# Patient Record
Sex: Male | Born: 1980 | Race: Black or African American | Hispanic: No | Marital: Single | Smoking: Never smoker
Health system: Southern US, Community
[De-identification: ages and names within clinical notes are randomized; demographics above are authoritative.]

---

## 2009-01-27 ENCOUNTER — Emergency Department (HOSPITAL_BASED_OUTPATIENT_CLINIC_OR_DEPARTMENT_OTHER): Admission: EM | Admit: 2009-01-27 | Discharge: 2009-01-27 | Payer: Self-pay | Admitting: Emergency Medicine

## 2010-11-01 LAB — POCT TOXICOLOGY PANEL

## 2010-11-01 LAB — CBC
HCT: 47.6 % (ref 39.0–52.0)
MCHC: 33.2 g/dL (ref 30.0–36.0)
MCV: 82.9 fL (ref 78.0–100.0)
Platelets: 179 10*3/uL (ref 150–400)
RDW: 11.6 % (ref 11.5–15.5)

## 2010-11-01 LAB — DIFFERENTIAL
Basophils Absolute: 0.2 10*3/uL — ABNORMAL HIGH (ref 0.0–0.1)
Lymphocytes Relative: 12 % (ref 12–46)
Monocytes Absolute: 0.9 10*3/uL (ref 0.1–1.0)
Monocytes Relative: 8 % (ref 3–12)
Neutro Abs: 8.9 10*3/uL — ABNORMAL HIGH (ref 1.7–7.7)

## 2010-11-01 LAB — COMPREHENSIVE METABOLIC PANEL
Albumin: 4.3 g/dL (ref 3.5–5.2)
BUN: 14 mg/dL (ref 6–23)
Creatinine, Ser: 1.2 mg/dL (ref 0.4–1.5)
Total Bilirubin: 0.8 mg/dL (ref 0.3–1.2)
Total Protein: 7.7 g/dL (ref 6.0–8.3)

## 2015-06-16 ENCOUNTER — Ambulatory Visit: Payer: Self-pay | Admitting: Obstetrics and Gynecology

## 2015-07-04 ENCOUNTER — Ambulatory Visit: Payer: Self-pay | Admitting: Obstetrics and Gynecology

## 2015-10-14 ENCOUNTER — Emergency Department (HOSPITAL_COMMUNITY)
Admission: EM | Admit: 2015-10-14 | Discharge: 2015-10-14 | Disposition: A | Discharge status: Home / Self Care | Payer: 59 | Source: Home / Self Care | Attending: Family Medicine | Admitting: Family Medicine

## 2015-10-14 ENCOUNTER — Encounter (HOSPITAL_COMMUNITY): Payer: Self-pay | Admitting: Emergency Medicine

## 2015-10-14 DIAGNOSIS — J302 Other seasonal allergic rhinitis: Secondary | ICD-10-CM | POA: Diagnosis not present

## 2015-10-14 MED ORDER — PREDNISONE 50 MG PO TABS: ORAL_TABLET | ORAL | Status: DC

## 2015-10-14 MED ORDER — IPRATROPIUM BROMIDE 0.06 % NA SOLN: 2.0000 | Freq: Four times a day (QID) | NASAL | Status: DC

## 2015-10-14 MED ORDER — METHYLPREDNISOLONE ACETATE 40 MG/ML IJ SUSP: 80.0000 mg | Freq: Once | INTRAMUSCULAR | Status: DC

## 2015-10-14 NOTE — ED Provider Notes (Signed)
CSN: [id number]     Arrival date & time 10/14/15  1302 History   First MD Initiated Contact with Patient 10/14/15 1323     No chief complaint on file.  (Consider location/radiation/quality/duration/timing/severity/associated sxs/prior Treatment) Patient is a 35 y.o. male presenting with allergic reaction. The history is provided by the patient.  Allergic Reaction Presenting symptoms: itching   Severity:  Moderate Prior allergic episodes:  No prior episodes Relieved by:  None tried Worsened by:  Nothing tried Ineffective treatments:  None tried   No past medical history on file. No past surgical history on file. No family history on file. Social History  Substance Use Topics  . Smoking status: Not on file  . Smokeless tobacco: Not on file  . Alcohol Use: Not on file    Review of Systems  Constitutional: Positive for fever. Negative for chills and appetite change.  HENT: Positive for congestion, postnasal drip and rhinorrhea.   Respiratory: Negative.   Cardiovascular: Negative.   Gastrointestinal: Negative.   Genitourinary: Negative.   Musculoskeletal: Negative.   Skin: Positive for itching.  All other systems reviewed and are negative.   Allergies  Review of patient's allergies indicates not on file.  Home Medications   Prior to Admission medications   Medication Sig Start Date End Date Taking? Authorizing Provider  ipratropium (ATROVENT) 0.06 % nasal spray Place 2 sprays into both nostrils 4 (four) times daily. 10/14/15   Linna HoffJames D Delaynie Stetzer, MD  predniSONE (DELTASONE) 50 MG tablet 1 tab daily for 2 days then 1/2 tab daily for 2 days. 10/14/15   Linna HoffJames D Walid Haig, MD   Meds Ordered and Administered this Visit   Medications  methylPREDNISolone acetate (DEPO-MEDROL) injection 80 mg (not administered)    BP 112/72 mmHg  Pulse 113  Temp(Src) 100.5 F (38.1 C) (Oral)  Resp 16  SpO2 96% No data found.   Physical Exam  Constitutional: He is oriented to person, place, and  time. He appears well-developed and well-nourished. No distress.  HENT:  Right Ear: External ear normal.  Left Ear: External ear normal.  Mouth/Throat: Oropharynx is clear and moist.  Eyes: Pupils are equal, round, and reactive to light.  Neck: Normal range of motion. Neck supple.  Cardiovascular: Normal rate, regular rhythm, normal heart sounds and intact distal pulses.   Pulmonary/Chest: Effort normal and breath sounds normal.  Abdominal: Soft. Bowel sounds are normal.  Neurological: He is alert and oriented to person, place, and time.  Skin: Skin is warm.  Nursing note and vitals reviewed.   ED Course  Procedures (including critical care time)  Labs Review Labs Reviewed - No data to display  Imaging Review No results found.   Visual Acuity Review  Right Eye Distance:   Left Eye Distance:   Bilateral Distance:    Right Eye Near:   Left Eye Near:    Bilateral Near:         MDM   1. Seasonal allergic rhinitis        Linna HoffJames D Jenice Leiner, MD 10/14/15 1357

## 2015-10-14 NOTE — ED Notes (Signed)
Onset Sunday 3/19 of sinus drainage and congestion, runny nose, cough due to congestion

## 2016-11-16 DIAGNOSIS — Z13220 Encounter for screening for lipoid disorders: Secondary | ICD-10-CM | POA: Diagnosis not present

## 2016-11-16 DIAGNOSIS — Z0000 Encounter for general adult medical examination without abnormal findings: Secondary | ICD-10-CM | POA: Diagnosis not present

## 2017-06-22 DIAGNOSIS — M545 Low back pain: Secondary | ICD-10-CM | POA: Diagnosis not present

## 2017-11-21 DIAGNOSIS — Z0000 Encounter for general adult medical examination without abnormal findings: Secondary | ICD-10-CM | POA: Diagnosis not present

## 2018-07-01 ENCOUNTER — Emergency Department (HOSPITAL_COMMUNITY)
Admission: EM | Admit: 2018-07-01 | Discharge: 2018-07-01 | Disposition: A | Discharge status: Home / Self Care | Payer: 59 | Attending: Emergency Medicine | Admitting: Emergency Medicine

## 2018-07-01 ENCOUNTER — Encounter (HOSPITAL_COMMUNITY): Payer: Self-pay | Admitting: Emergency Medicine

## 2018-07-01 ENCOUNTER — Emergency Department (HOSPITAL_COMMUNITY): Payer: 59

## 2018-07-01 ENCOUNTER — Other Ambulatory Visit: Payer: Self-pay

## 2018-07-01 DIAGNOSIS — R102 Pelvic and perineal pain: Secondary | ICD-10-CM | POA: Diagnosis not present

## 2018-07-01 DIAGNOSIS — Y929 Unspecified place or not applicable: Secondary | ICD-10-CM | POA: Diagnosis not present

## 2018-07-01 DIAGNOSIS — M79604 Pain in right leg: Secondary | ICD-10-CM | POA: Diagnosis not present

## 2018-07-01 DIAGNOSIS — S8991XA Unspecified injury of right lower leg, initial encounter: Secondary | ICD-10-CM | POA: Diagnosis not present

## 2018-07-01 DIAGNOSIS — S81811A Laceration without foreign body, right lower leg, initial encounter: Secondary | ICD-10-CM

## 2018-07-01 DIAGNOSIS — G4489 Other headache syndrome: Secondary | ICD-10-CM | POA: Diagnosis not present

## 2018-07-01 DIAGNOSIS — R109 Unspecified abdominal pain: Secondary | ICD-10-CM | POA: Insufficient documentation

## 2018-07-01 DIAGNOSIS — Y999 Unspecified external cause status: Secondary | ICD-10-CM | POA: Insufficient documentation

## 2018-07-01 DIAGNOSIS — M542 Cervicalgia: Secondary | ICD-10-CM | POA: Insufficient documentation

## 2018-07-01 DIAGNOSIS — R0789 Other chest pain: Secondary | ICD-10-CM | POA: Diagnosis not present

## 2018-07-01 DIAGNOSIS — R079 Chest pain, unspecified: Secondary | ICD-10-CM | POA: Diagnosis not present

## 2018-07-01 DIAGNOSIS — S199XXA Unspecified injury of neck, initial encounter: Secondary | ICD-10-CM | POA: Diagnosis not present

## 2018-07-01 DIAGNOSIS — R51 Headache: Secondary | ICD-10-CM | POA: Diagnosis not present

## 2018-07-01 DIAGNOSIS — S0993XA Unspecified injury of face, initial encounter: Secondary | ICD-10-CM | POA: Diagnosis not present

## 2018-07-01 DIAGNOSIS — S299XXA Unspecified injury of thorax, initial encounter: Secondary | ICD-10-CM | POA: Diagnosis not present

## 2018-07-01 DIAGNOSIS — S3991XA Unspecified injury of abdomen, initial encounter: Secondary | ICD-10-CM | POA: Diagnosis not present

## 2018-07-01 DIAGNOSIS — Y9389 Activity, other specified: Secondary | ICD-10-CM | POA: Diagnosis not present

## 2018-07-01 DIAGNOSIS — Z79899 Other long term (current) drug therapy: Secondary | ICD-10-CM | POA: Diagnosis not present

## 2018-07-01 DIAGNOSIS — S0990XA Unspecified injury of head, initial encounter: Secondary | ICD-10-CM | POA: Diagnosis not present

## 2018-07-01 LAB — PROTIME-INR
INR: 0.98
Prothrombin Time: 12.9 seconds (ref 11.4–15.2)

## 2018-07-01 LAB — I-STAT CHEM 8, ED
BUN: 10 mg/dL (ref 6–20)
CALCIUM ION: 1.09 mmol/L — AB (ref 1.15–1.40)
CHLORIDE: 101 mmol/L (ref 98–111)
Creatinine, Ser: 1.1 mg/dL (ref 0.61–1.24)
GLUCOSE: 146 mg/dL — AB (ref 70–99)
HCT: 45 % (ref 39.0–52.0)
Hemoglobin: 15.3 g/dL (ref 13.0–17.0)
Potassium: 3.6 mmol/L (ref 3.5–5.1)
SODIUM: 137 mmol/L (ref 135–145)
TCO2: 28 mmol/L (ref 22–32)

## 2018-07-01 LAB — COMPREHENSIVE METABOLIC PANEL
ALK PHOS: 68 U/L (ref 38–126)
ALT: 58 U/L — ABNORMAL HIGH (ref 0–44)
ANION GAP: 13 (ref 5–15)
AST: 41 U/L (ref 15–41)
Albumin: 3.9 g/dL (ref 3.5–5.0)
BILIRUBIN TOTAL: 1.1 mg/dL (ref 0.3–1.2)
BUN: 9 mg/dL (ref 6–20)
CALCIUM: 8.9 mg/dL (ref 8.9–10.3)
CO2: 24 mmol/L (ref 22–32)
Chloride: 100 mmol/L (ref 98–111)
Creatinine, Ser: 1.17 mg/dL (ref 0.61–1.24)
GFR calc Af Amer: >60 mL/min (ref >60)
GFR calc non Af Amer: >60 mL/min (ref >60)
Glucose, Bld: 151 mg/dL — ABNORMAL HIGH (ref 70–99)
POTASSIUM: 3.9 mmol/L (ref 3.5–5.1)
Sodium: 137 mmol/L (ref 135–145)
TOTAL PROTEIN: 6.7 g/dL (ref 6.5–8.1)

## 2018-07-01 LAB — URINALYSIS, ROUTINE W REFLEX MICROSCOPIC
Bilirubin Urine: NEGATIVE
Glucose, UA: NEGATIVE mg/dL
Hgb urine dipstick: NEGATIVE
Ketones, ur: NEGATIVE mg/dL
Leukocytes, UA: NEGATIVE
Nitrite: NEGATIVE
Protein, ur: NEGATIVE mg/dL
Specific Gravity, Urine: 1.014 (ref 1.005–1.030)
pH: 9 — ABNORMAL HIGH (ref 5.0–8.0)

## 2018-07-01 LAB — CBC
HCT: 45.4 % (ref 39.0–52.0)
HEMOGLOBIN: 14.7 g/dL (ref 13.0–17.0)
MCH: 26.6 pg (ref 26.0–34.0)
MCHC: 32.4 g/dL (ref 30.0–36.0)
MCV: 82.1 fL (ref 80.0–100.0)
PLATELETS: 204 10*3/uL (ref 150–400)
RBC: 5.53 MIL/uL (ref 4.22–5.81)
RDW: 12.2 % (ref 11.5–15.5)
WBC: 6.7 10*3/uL (ref 4.0–10.5)
nRBC: 0 % (ref 0.0–0.2)

## 2018-07-01 LAB — CDS SEROLOGY

## 2018-07-01 LAB — SAMPLE TO BLOOD BANK

## 2018-07-01 LAB — I-STAT CG4 LACTIC ACID, ED: Lactic Acid, Venous: 1.75 mmol/L (ref 0.5–1.9)

## 2018-07-01 MED ORDER — KETOROLAC TROMETHAMINE 30 MG/ML IJ SOLN
15.0000 mg | Freq: Once | INTRAMUSCULAR | Status: AC
  Administered 2018-07-01: 15 mg via INTRAVENOUS
  Filled 2018-07-01: qty 1

## 2018-07-01 MED ORDER — MORPHINE SULFATE (PF) 4 MG/ML IV SOLN
4.0000 mg | Freq: Once | INTRAVENOUS | Status: AC
Start: 2018-07-01 — End: 2018-07-01
  Administered 2018-07-01: 4 mg via INTRAVENOUS
  Filled 2018-07-01: qty 1

## 2018-07-01 MED ORDER — IOHEXOL 300 MG/ML  SOLN
100.0000 mL | Freq: Once | INTRAMUSCULAR | Status: AC | PRN
  Administered 2018-07-01: 100 mL via INTRAVENOUS

## 2018-07-01 MED ORDER — METHOCARBAMOL 500 MG PO TABS: 500.0000 mg | ORAL_TABLET | Freq: Two times a day (BID) | ORAL | 0 refills | Status: DC | PRN

## 2018-07-01 MED ORDER — LIDOCAINE HCL (PF) 1 % IJ SOLN
5.0000 mL | Freq: Once | INTRAMUSCULAR | Status: AC
  Administered 2018-07-01: 5 mL via INTRADERMAL
  Filled 2018-07-01: qty 5

## 2018-07-01 NOTE — ED Notes (Signed)
Pt transported toi CT
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
## 2018-07-01 NOTE — ED Notes (Signed)
Pt transported to CT ?
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
## 2018-07-06 DIAGNOSIS — G479 Sleep disorder, unspecified: Secondary | ICD-10-CM | POA: Diagnosis not present

## 2018-07-06 DIAGNOSIS — S81811A Laceration without foreign body, right lower leg, initial encounter: Secondary | ICD-10-CM | POA: Diagnosis not present

## 2018-07-06 DIAGNOSIS — R079 Chest pain, unspecified: Secondary | ICD-10-CM | POA: Diagnosis not present

## 2018-07-10 DIAGNOSIS — Z4802 Encounter for removal of sutures: Secondary | ICD-10-CM | POA: Diagnosis not present

## 2018-07-10 DIAGNOSIS — S81812D Laceration without foreign body, left lower leg, subsequent encounter: Secondary | ICD-10-CM | POA: Diagnosis not present

## 2018-07-10 DIAGNOSIS — R079 Chest pain, unspecified: Secondary | ICD-10-CM | POA: Diagnosis not present

## 2018-07-31 DIAGNOSIS — R079 Chest pain, unspecified: Secondary | ICD-10-CM | POA: Diagnosis not present

## 2018-07-31 DIAGNOSIS — M545 Low back pain: Secondary | ICD-10-CM | POA: Diagnosis not present

## 2018-07-31 DIAGNOSIS — M25532 Pain in left wrist: Secondary | ICD-10-CM | POA: Diagnosis not present

## 2018-08-11 DIAGNOSIS — R55 Syncope and collapse: Secondary | ICD-10-CM | POA: Diagnosis not present

## 2018-08-11 DIAGNOSIS — M25532 Pain in left wrist: Secondary | ICD-10-CM | POA: Insufficient documentation

## 2018-08-11 DIAGNOSIS — M545 Low back pain: Secondary | ICD-10-CM | POA: Diagnosis not present

## 2018-08-19 DIAGNOSIS — R55 Syncope and collapse: Secondary | ICD-10-CM | POA: Diagnosis not present

## 2018-08-19 DIAGNOSIS — M5416 Radiculopathy, lumbar region: Secondary | ICD-10-CM | POA: Diagnosis not present

## 2018-08-25 DIAGNOSIS — M25532 Pain in left wrist: Secondary | ICD-10-CM | POA: Diagnosis not present

## 2018-08-25 DIAGNOSIS — M545 Low back pain: Secondary | ICD-10-CM | POA: Diagnosis not present

## 2018-09-06 DIAGNOSIS — M545 Low back pain, unspecified: Secondary | ICD-10-CM | POA: Insufficient documentation

## 2018-09-08 DIAGNOSIS — M545 Low back pain: Secondary | ICD-10-CM | POA: Diagnosis not present

## 2018-09-11 DIAGNOSIS — M545 Low back pain: Secondary | ICD-10-CM | POA: Diagnosis not present

## 2018-09-15 DIAGNOSIS — M545 Low back pain: Secondary | ICD-10-CM | POA: Diagnosis not present

## 2018-09-15 DIAGNOSIS — M25532 Pain in left wrist: Secondary | ICD-10-CM | POA: Diagnosis not present

## 2018-09-18 DIAGNOSIS — M545 Low back pain: Secondary | ICD-10-CM | POA: Diagnosis not present

## 2018-09-19 DIAGNOSIS — M545 Low back pain: Secondary | ICD-10-CM | POA: Diagnosis not present

## 2018-09-21 DIAGNOSIS — M545 Low back pain: Secondary | ICD-10-CM | POA: Diagnosis not present

## 2018-09-25 DIAGNOSIS — M25532 Pain in left wrist: Secondary | ICD-10-CM | POA: Diagnosis not present

## 2018-09-25 DIAGNOSIS — M545 Low back pain: Secondary | ICD-10-CM | POA: Diagnosis not present

## 2018-10-02 DIAGNOSIS — R0681 Apnea, not elsewhere classified: Secondary | ICD-10-CM | POA: Diagnosis not present

## 2018-10-02 DIAGNOSIS — M25532 Pain in left wrist: Secondary | ICD-10-CM | POA: Diagnosis not present

## 2018-10-03 DIAGNOSIS — S63392A Traumatic rupture of other ligament of left wrist, initial encounter: Secondary | ICD-10-CM | POA: Diagnosis not present

## 2018-10-03 DIAGNOSIS — S638X2A Sprain of other part of left wrist and hand, initial encounter: Secondary | ICD-10-CM | POA: Insufficient documentation

## 2018-10-03 DIAGNOSIS — M25532 Pain in left wrist: Secondary | ICD-10-CM | POA: Diagnosis not present

## 2018-10-04 DIAGNOSIS — M545 Low back pain: Secondary | ICD-10-CM | POA: Diagnosis not present

## 2018-10-06 DIAGNOSIS — M545 Low back pain: Secondary | ICD-10-CM | POA: Diagnosis not present

## 2018-11-09 DIAGNOSIS — S63392A Traumatic rupture of other ligament of left wrist, initial encounter: Secondary | ICD-10-CM | POA: Diagnosis not present

## 2018-11-10 DIAGNOSIS — M545 Low back pain: Secondary | ICD-10-CM | POA: Diagnosis not present

## 2018-11-14 DIAGNOSIS — M545 Low back pain: Secondary | ICD-10-CM | POA: Diagnosis not present

## 2018-11-15 DIAGNOSIS — R198 Other specified symptoms and signs involving the digestive system and abdomen: Secondary | ICD-10-CM | POA: Diagnosis not present

## 2018-11-16 DIAGNOSIS — M545 Low back pain: Secondary | ICD-10-CM | POA: Diagnosis not present

## 2018-11-20 DIAGNOSIS — M25532 Pain in left wrist: Secondary | ICD-10-CM | POA: Diagnosis not present

## 2018-11-21 DIAGNOSIS — M545 Low back pain: Secondary | ICD-10-CM | POA: Diagnosis not present

## 2018-11-23 DIAGNOSIS — M545 Low back pain: Secondary | ICD-10-CM | POA: Diagnosis not present

## 2018-11-28 DIAGNOSIS — M25532 Pain in left wrist: Secondary | ICD-10-CM | POA: Diagnosis not present

## 2018-11-30 DIAGNOSIS — M545 Low back pain: Secondary | ICD-10-CM | POA: Diagnosis not present

## 2018-12-04 DIAGNOSIS — M25532 Pain in left wrist: Secondary | ICD-10-CM | POA: Diagnosis not present

## 2018-12-05 DIAGNOSIS — M545 Low back pain: Secondary | ICD-10-CM | POA: Diagnosis not present

## 2018-12-06 DIAGNOSIS — G4733 Obstructive sleep apnea (adult) (pediatric): Secondary | ICD-10-CM | POA: Diagnosis not present

## 2018-12-06 DIAGNOSIS — E669 Obesity, unspecified: Secondary | ICD-10-CM | POA: Diagnosis not present

## 2018-12-11 DIAGNOSIS — M25532 Pain in left wrist: Secondary | ICD-10-CM | POA: Diagnosis not present

## 2018-12-12 DIAGNOSIS — S63392A Traumatic rupture of other ligament of left wrist, initial encounter: Secondary | ICD-10-CM | POA: Diagnosis not present

## 2018-12-19 DIAGNOSIS — M545 Low back pain: Secondary | ICD-10-CM | POA: Diagnosis not present

## 2018-12-22 DIAGNOSIS — M25532 Pain in left wrist: Secondary | ICD-10-CM | POA: Diagnosis not present

## 2019-04-12 ENCOUNTER — Encounter: Payer: Self-pay | Admitting: Podiatry

## 2019-04-12 ENCOUNTER — Ambulatory Visit (INDEPENDENT_AMBULATORY_CARE_PROVIDER_SITE_OTHER): Payer: 59

## 2019-04-12 ENCOUNTER — Ambulatory Visit: Payer: 59 | Admitting: Podiatry

## 2019-04-12 ENCOUNTER — Other Ambulatory Visit: Payer: Self-pay

## 2019-04-12 VITALS — BP 109/63 | HR 74 | Resp 16

## 2019-04-12 DIAGNOSIS — M2142 Flat foot [pes planus] (acquired), left foot: Secondary | ICD-10-CM

## 2019-04-12 DIAGNOSIS — M2141 Flat foot [pes planus] (acquired), right foot: Secondary | ICD-10-CM

## 2019-04-12 NOTE — Progress Notes (Signed)
  Subjective:  Patient ID: Adrian Harper, male    DOB: 1980-09-26,  MRN: [id number] HPI Chief Complaint  Patient presents with  . Foot Pain    Flat feet bilateral - patient has discomfort with increased activity  . New Patient (Initial Visit)    38 y.o. male presents with the above complaint.   ROS: Denies fever chills nausea vomiting muscle aches pains calf pain back pain chest pain shortness of breath.  No past medical history on file. No past surgical history on file.  Current Outpatient Medications:  .  modafinil (PROVIGIL) 200 MG tablet, TAKE 1 TABLET BY MOUTH EVERY DAY IN THE MORNING, Disp: , Rfl:   No Known Allergies Review of Systems Objective:   Vitals:   04/12/19 1422  BP: 109/63  Pulse: 74  Resp: 16    General: Well developed, nourished, in no acute distress, alert and oriented x3   Dermatological: Skin is warm, dry and supple bilateral. Nails x 10 are well maintained; remaining integument appears unremarkable at this time. There are no open sores, no preulcerative lesions, no rash or signs of infection present.  Vascular: Dorsalis Pedis artery and Posterior Tibial artery pedal pulses are 2/4 bilateral with immedate capillary fill time. Pedal hair growth present. No varicosities and no lower extremity edema present bilateral.   Neruologic: Grossly intact via light touch bilateral. Vibratory intact via tuning fork bilateral. Protective threshold with Semmes Wienstein monofilament intact to all pedal sites bilateral. Patellar and Achilles deep tendon reflexes 2+ bilateral. No Babinski or clonus noted bilateral.   Musculoskeletal: No gross boney pedal deformities bilateral. No pain, crepitus, or limitation noted with foot and ankle range of motion bilateral. Muscular strength 5/5 in all groups tested bilateral.  Gait: Unassisted, Nonantalgic.    Radiographs:  Radiographs taken today demonstrate an osseously mature individual with mild pes planus no major osseous  abnormalities.  Assessment & Plan:   Assessment: Pes planus with fatigue and some early signs of fasciitis.  Plan: Recommended orthotics.  He was see Liliane Channel today for orthotic casting.     Grenda Lora T. [place], [place]

## 2019-05-10 ENCOUNTER — Other Ambulatory Visit: Payer: Self-pay

## 2019-05-10 ENCOUNTER — Ambulatory Visit: Payer: 59 | Admitting: Orthotics

## 2019-05-10 DIAGNOSIS — M2141 Flat foot [pes planus] (acquired), right foot: Secondary | ICD-10-CM

## 2019-05-10 DIAGNOSIS — M2142 Flat foot [pes planus] (acquired), left foot: Secondary | ICD-10-CM

## 2019-05-10 NOTE — Progress Notes (Signed)
Patient came in today to pick up custom made foot orthotics.  The goals were accomplished and the patient reported no dissatisfaction with said orthotics.  Patient was advised of breakin period and how to report any issues. 

## 2019-09-29 ENCOUNTER — Other Ambulatory Visit: Payer: Self-pay

## 2019-09-29 ENCOUNTER — Encounter (HOSPITAL_BASED_OUTPATIENT_CLINIC_OR_DEPARTMENT_OTHER): Payer: Self-pay

## 2019-09-29 ENCOUNTER — Emergency Department (HOSPITAL_BASED_OUTPATIENT_CLINIC_OR_DEPARTMENT_OTHER): Payer: 59

## 2019-09-29 ENCOUNTER — Emergency Department (HOSPITAL_BASED_OUTPATIENT_CLINIC_OR_DEPARTMENT_OTHER)
Admission: EM | Admit: 2019-09-29 | Discharge: 2019-09-29 | Disposition: A | Discharge status: Home / Self Care | Payer: 59 | Attending: Emergency Medicine | Admitting: Emergency Medicine

## 2019-09-29 DIAGNOSIS — Y9289 Other specified places as the place of occurrence of the external cause: Secondary | ICD-10-CM | POA: Diagnosis not present

## 2019-09-29 DIAGNOSIS — Y9389 Activity, other specified: Secondary | ICD-10-CM | POA: Insufficient documentation

## 2019-09-29 DIAGNOSIS — W500XXA Accidental hit or strike by another person, initial encounter: Secondary | ICD-10-CM | POA: Diagnosis not present

## 2019-09-29 DIAGNOSIS — S060X0A Concussion without loss of consciousness, initial encounter: Secondary | ICD-10-CM

## 2019-09-29 DIAGNOSIS — S0990XA Unspecified injury of head, initial encounter: Secondary | ICD-10-CM | POA: Diagnosis present

## 2019-09-29 DIAGNOSIS — Y999 Unspecified external cause status: Secondary | ICD-10-CM | POA: Diagnosis not present
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
## 2019-09-29 NOTE — Discharge Instructions (Signed)
Please read and follow all provided instructions.  Your diagnoses today include:  1. Minor head injury, initial encounter   2. Concussion without loss of consciousness, initial encounter     Tests performed today include:  CT scan of your head that did not show any serious injury.  Vital signs. See below for your results today.   Medications prescribed:   None  Take any prescribed medications only as directed.  Home care instructions:  Follow any educational materials contained in this packet.  BE VERY CAREFUL not to take multiple medicines containing Tylenol (also called acetaminophen). Doing so can lead to an overdose which can damage your liver and cause liver failure and possibly death.   Follow-up instructions: Please follow-up with your primary care provider in the next 3 days for further evaluation of your symptoms if not improved.   Return instructions:  SEEK IMMEDIATE MEDICAL ATTENTION IF:  There is confusion or drowsiness (although children frequently become drowsy after injury).   You cannot awaken the injured person.   You have more than one episode of vomiting.   You notice dizziness or unsteadiness which is getting worse, or inability to walk.   You have convulsions or unconsciousness.   You experience severe, persistent headaches not relieved by Tylenol.  You cannot use arms or legs normally.   There are changes in pupil sizes. (This is the black center in the colored part of the eye)   There is clear or bloody discharge from the nose or ears.   You have change in speech, vision, swallowing, or understanding.   Localized weakness, numbness, tingling, or change in bowel or bladder control.  You have any other emergent concerns.  Additional Information: You have had a head injury which does not appear to require admission at this time.  Your vital signs today were: BP 129/89 (BP Location: Right Arm)   Pulse 71   Temp 97.8 F (36.6 C) (Oral)    Resp 18   Ht 5\' 9"  (1.753 m)   Wt 97.5 kg   SpO2 100%   BMI 31.75 kg/m  If your blood pressure (BP) was elevated above 135/85 this visit, please have this repeated by your doctor within one month. --------------

## 2019-11-23 ENCOUNTER — Ambulatory Visit: Payer: 59 | Attending: Internal Medicine

## 2019-11-23 DIAGNOSIS — Z23 Encounter for immunization: Secondary | ICD-10-CM

## 2019-11-23 NOTE — Progress Notes (Signed)
   Covid-19 Vaccination Clinic  Name:  Adrian Harper    MRN: [id number] DOB: 1980-09-29  11/23/2019  Mr. [name] was observed post Covid-19 immunization for 15 minutes without incident. He was provided with Vaccine Information Sheet and instruction to access the V-Safe system.   Mr. [name] was instructed to call 911 with any severe reactions post vaccine: [name] Kitchen Difficulty breathing  . Swelling of face and throat  . A fast heartbeat  . A bad rash all over body  . Dizziness and weakness   Immunizations Administered    Name Date Dose VIS Date Route   Pfizer COVID-19 Vaccine 11/23/2019  4:55 PM 0.3 mL 09/19/2018 Intramuscular   Manufacturer: [employer], [employer]   Lot: [id number]   NDC: [address]-8

## 2019-12-17 ENCOUNTER — Ambulatory Visit: Payer: 59 | Attending: Internal Medicine

## 2019-12-17 DIAGNOSIS — Z23 Encounter for immunization: Secondary | ICD-10-CM

## 2019-12-17 NOTE — Progress Notes (Signed)
   Covid-19 Vaccination Clinic  Name:  Eldo Umanzor    MRN: [id number] DOB: 1981-04-22  12/17/2019  Mr. Holsopple was observed post Covid-19 immunization for 15 minutes without incident. He was provided with Vaccine Information Sheet and instruction to access the V-Safe system.   Mr. Gadson was instructed to call 911 with any severe reactions post vaccine: [name] Kitchen Difficulty breathing  . Swelling of face and throat  . A fast heartbeat  . A bad rash all over body  . Dizziness and weakness   Immunizations Administered    Name Date Dose VIS Date Route   Pfizer COVID-19 Vaccine 12/17/2019  4:59 PM 0.3 mL 09/19/2018 Intramuscular   Manufacturer: [employer], [employer]   Lot: [id number]   NDC: [address]-4
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
## 2021-09-24 ENCOUNTER — Encounter (HOSPITAL_BASED_OUTPATIENT_CLINIC_OR_DEPARTMENT_OTHER): Payer: Self-pay

## 2021-09-24 ENCOUNTER — Other Ambulatory Visit (HOSPITAL_COMMUNITY): Payer: Self-pay | Admitting: Family Medicine

## 2021-09-24 ENCOUNTER — Encounter (HOSPITAL_COMMUNITY)
Admission: EM | Disposition: A | Discharge status: Home / Self Care | Payer: Self-pay | Source: Home / Self Care | Attending: Emergency Medicine

## 2021-09-24 ENCOUNTER — Other Ambulatory Visit: Payer: Self-pay

## 2021-09-24 ENCOUNTER — Emergency Department (HOSPITAL_COMMUNITY): Payer: 59 | Admitting: Anesthesiology

## 2021-09-24 ENCOUNTER — Emergency Department (HOSPITAL_BASED_OUTPATIENT_CLINIC_OR_DEPARTMENT_OTHER): Payer: 59

## 2021-09-24 ENCOUNTER — Other Ambulatory Visit: Payer: Self-pay | Admitting: Family Medicine

## 2021-09-24 ENCOUNTER — Observation Stay (HOSPITAL_BASED_OUTPATIENT_CLINIC_OR_DEPARTMENT_OTHER)
Admission: EM | Admit: 2021-09-24 | Discharge: 2021-09-25 | Disposition: A | Discharge status: Home / Self Care | Payer: 59 | Attending: General Surgery | Admitting: General Surgery

## 2021-09-24 DIAGNOSIS — Z9049 Acquired absence of other specified parts of digestive tract: Secondary | ICD-10-CM

## 2021-09-24 DIAGNOSIS — Z9889 Other specified postprocedural states: Secondary | ICD-10-CM

## 2021-09-24 DIAGNOSIS — G4733 Obstructive sleep apnea (adult) (pediatric): Secondary | ICD-10-CM | POA: Diagnosis not present

## 2021-09-24 DIAGNOSIS — K3589 Other acute appendicitis without perforation or gangrene: Secondary | ICD-10-CM | POA: Diagnosis not present

## 2021-09-24 DIAGNOSIS — K3580 Unspecified acute appendicitis: Secondary | ICD-10-CM | POA: Diagnosis not present

## 2021-09-24 DIAGNOSIS — Z9989 Dependence on other enabling machines and devices: Secondary | ICD-10-CM

## 2021-09-24 DIAGNOSIS — K353 Acute appendicitis with localized peritonitis, without perforation or gangrene: Secondary | ICD-10-CM

## 2021-09-24 DIAGNOSIS — Z20822 Contact with and (suspected) exposure to covid-19: Secondary | ICD-10-CM | POA: Insufficient documentation

## 2021-09-24 DIAGNOSIS — R1031 Right lower quadrant pain: Secondary | ICD-10-CM | POA: Diagnosis present

## 2021-09-24 HISTORY — PX: LAPAROSCOPIC APPENDECTOMY: SHX408

## 2021-09-24 LAB — COMPREHENSIVE METABOLIC PANEL
ALT: 68 U/L — ABNORMAL HIGH (ref 0–44)
AST: 31 U/L (ref 15–41)
Albumin: 4.7 g/dL (ref 3.5–5.0)
Alkaline Phosphatase: 69 U/L (ref 38–126)
Anion gap: 8 (ref 5–15)
BUN: 10 mg/dL (ref 6–20)
CO2: 30 mmol/L (ref 22–32)
Calcium: 9.8 mg/dL (ref 8.9–10.3)
Chloride: 99 mmol/L (ref 98–111)
Creatinine, Ser: 1.1 mg/dL (ref 0.61–1.24)
GFR, Estimated: >60 mL/min (ref >60)
Glucose, Bld: 94 mg/dL (ref 70–99)
Potassium: 3.6 mmol/L (ref 3.5–5.1)
Sodium: 137 mmol/L (ref 135–145)
Total Bilirubin: 1.3 mg/dL — ABNORMAL HIGH (ref 0.3–1.2)
Total Protein: 8.1 g/dL (ref 6.5–8.1)

## 2021-09-24 LAB — CBC WITH DIFFERENTIAL/PLATELET
Abs Immature Granulocytes: 0.04 10*3/uL (ref 0.00–0.07)
Basophils Absolute: 0.1 10*3/uL (ref 0.0–0.1)
Basophils Relative: 1 %
Eosinophils Absolute: 0.2 10*3/uL (ref 0.0–0.5)
Eosinophils Relative: 2 %
HCT: 49.1 % (ref 39.0–52.0)
Hemoglobin: 15.9 g/dL (ref 13.0–17.0)
Immature Granulocytes: 0 %
Lymphocytes Relative: 20 %
Lymphs Abs: 1.9 10*3/uL (ref 0.7–4.0)
MCH: 26.2 pg (ref 26.0–34.0)
MCHC: 32.4 g/dL (ref 30.0–36.0)
MCV: 81 fL (ref 80.0–100.0)
Monocytes Absolute: 1 10*3/uL (ref 0.1–1.0)
Monocytes Relative: 10 %
Neutro Abs: 6.3 10*3/uL (ref 1.7–7.7)
Neutrophils Relative %: 67 %
Platelets: 233 10*3/uL (ref 150–400)
RBC: 6.06 MIL/uL — ABNORMAL HIGH (ref 4.22–5.81)
RDW: 12.8 % (ref 11.5–15.5)
WBC: 9.4 10*3/uL (ref 4.0–10.5)
nRBC: 0 % (ref 0.0–0.2)

## 2021-09-24 LAB — URINALYSIS, ROUTINE W REFLEX MICROSCOPIC
Bilirubin Urine: NEGATIVE
Glucose, UA: NEGATIVE mg/dL
Hgb urine dipstick: NEGATIVE
Ketones, ur: NEGATIVE mg/dL
Leukocytes,Ua: NEGATIVE
Nitrite: NEGATIVE
Protein, ur: NEGATIVE mg/dL
Specific Gravity, Urine: 1.011 (ref 1.005–1.030)
pH: 6 (ref 5.0–8.0)

## 2021-09-24 LAB — RESP PANEL BY RT-PCR (FLU A&B, COVID) ARPGX2
Influenza A by PCR: NEGATIVE
Influenza B by PCR: NEGATIVE
SARS Coronavirus 2 by RT PCR: NEGATIVE

## 2021-09-24 SURGERY — APPENDECTOMY, LAPAROSCOPIC
Anesthesia: General | Site: Abdomen

## 2021-09-24 MED ORDER — ROCURONIUM 10MG/ML (10ML) SYRINGE FOR MEDFUSION PUMP - OPTIME
INTRAVENOUS | Status: DC | PRN
  Administered 2021-09-24: 30 mg via INTRAVENOUS
  Administered 2021-09-24: 10 mg via INTRAVENOUS

## 2021-09-24 MED ORDER — FENTANYL CITRATE (PF) 250 MCG/5ML IJ SOLN
INTRAMUSCULAR | Status: DC | PRN
  Administered 2021-09-24: 100 ug via INTRAVENOUS

## 2021-09-24 MED ORDER — 0.9 % SODIUM CHLORIDE (POUR BTL) OPTIME
TOPICAL | Status: DC | PRN
  Administered 2021-09-24: 1000 mL

## 2021-09-24 MED ORDER — SODIUM CHLORIDE 0.9 % IR SOLN
Status: DC | PRN
  Administered 2021-09-24: 1000 mL

## 2021-09-24 MED ORDER — BUPIVACAINE-EPINEPHRINE 0.25% -1:200000 IJ SOLN
INTRAMUSCULAR | Status: DC | PRN
  Administered 2021-09-24: 10 mL

## 2021-09-24 MED ORDER — ONDANSETRON HCL 4 MG/2ML IJ SOLN
INTRAMUSCULAR | Status: DC | PRN
  Administered 2021-09-24: 4 mg via INTRAVENOUS

## 2021-09-24 MED ORDER — OXYCODONE HCL 5 MG PO TABS: 5.0000 mg | ORAL_TABLET | Freq: Once | ORAL | Status: DC | PRN

## 2021-09-24 MED ORDER — PROPOFOL 10 MG/ML IV BOLUS
INTRAVENOUS | Status: DC | PRN
  Administered 2021-09-24: 200 mg via INTRAVENOUS

## 2021-09-24 MED ORDER — LIDOCAINE HCL (CARDIAC) PF 100 MG/5ML IV SOSY
PREFILLED_SYRINGE | INTRAVENOUS | Status: DC | PRN
  Administered 2021-09-24: 40 mg via INTRAVENOUS

## 2021-09-24 MED ORDER — OXYCODONE HCL 5 MG/5ML PO SOLN: 5.0000 mg | Freq: Once | ORAL | Status: DC | PRN

## 2021-09-24 MED ORDER — LACTATED RINGERS IV SOLN: INTRAVENOUS | Status: DC | PRN

## 2021-09-24 MED ORDER — SUCCINYLCHOLINE 20MG/ML (10ML) SYRINGE FOR MEDFUSION PUMP - OPTIME
INTRAMUSCULAR | Status: DC | PRN
  Administered 2021-09-24: 200 mg via INTRAVENOUS

## 2021-09-24 MED ORDER — SODIUM CHLORIDE 0.9 % IV SOLN
2.0000 g | Freq: Once | INTRAVENOUS | Status: AC
  Administered 2021-09-24: 2 g via INTRAVENOUS
  Filled 2021-09-24: qty 20

## 2021-09-24 MED ORDER — MEPERIDINE HCL 25 MG/ML IJ SOLN: 6.2500 mg | INTRAMUSCULAR | Status: DC | PRN

## 2021-09-24 MED ORDER — HYDROMORPHONE HCL 1 MG/ML IJ SOLN: 0.2500 mg | INTRAMUSCULAR | Status: DC | PRN

## 2021-09-24 MED ORDER — IOHEXOL 300 MG/ML  SOLN
100.0000 mL | Freq: Once | INTRAMUSCULAR | Status: AC | PRN
  Administered 2021-09-24: 100 mL via INTRAVENOUS

## 2021-09-24 MED ORDER — DEXAMETHASONE SODIUM PHOSPHATE 10 MG/ML IJ SOLN
INTRAMUSCULAR | Status: DC | PRN
  Administered 2021-09-24: 10 mg via INTRAVENOUS

## 2021-09-24 MED ORDER — METRONIDAZOLE 500 MG/100ML IV SOLN
500.0000 mg | Freq: Once | INTRAVENOUS | Status: AC
  Administered 2021-09-24: 500 mg via INTRAVENOUS
  Filled 2021-09-24: qty 100

## 2021-09-24 MED ORDER — MIDAZOLAM HCL 2 MG/2ML IJ SOLN: 0.5000 mg | Freq: Once | INTRAMUSCULAR | Status: DC | PRN

## 2021-09-24 MED ORDER — MIDAZOLAM HCL 2 MG/2ML IJ SOLN
INTRAMUSCULAR | Status: DC | PRN
  Administered 2021-09-24: 2 mg via INTRAVENOUS

## 2021-09-24 MED ORDER — SUGAMMADEX SODIUM 200 MG/2ML IV SOLN
INTRAVENOUS | Status: DC | PRN
  Administered 2021-09-24: 200 mg via INTRAVENOUS

## 2021-09-24 SURGICAL SUPPLY — 46 items
APPLIER CLIP 5 13 M/L LIGAMAX5 (MISCELLANEOUS)
BAG COUNTER SPONGE SURGICOUNT (BAG) ×2 IMPLANT
BLADE CLIPPER SURG (BLADE) IMPLANT
CANISTER SUCT 3000ML PPV (MISCELLANEOUS) ×2 IMPLANT
CHLORAPREP W/TINT 26 (MISCELLANEOUS) ×2 IMPLANT
CLIP APPLIE 5 13 M/L LIGAMAX5 (MISCELLANEOUS) IMPLANT
CNTNR URN SCR LID CUP LEK RST (MISCELLANEOUS) IMPLANT
CONT SPEC 4OZ STRL OR WHT (MISCELLANEOUS) ×1
COVER SURGICAL LIGHT HANDLE (MISCELLANEOUS) ×2 IMPLANT
CUTTER FLEX LINEAR 45M (STAPLE) ×2 IMPLANT
DERMABOND ADHESIVE PROPEN (GAUZE/BANDAGES/DRESSINGS) ×1
DERMABOND ADVANCED (GAUZE/BANDAGES/DRESSINGS) ×1
DERMABOND ADVANCED .7 DNX12 (GAUZE/BANDAGES/DRESSINGS) ×1 IMPLANT
DERMABOND ADVANCED .7 DNX6 (GAUZE/BANDAGES/DRESSINGS) IMPLANT
ELECT REM PT RETURN 9FT ADLT (ELECTROSURGICAL) ×2
ELECTRODE REM PT RTRN 9FT ADLT (ELECTROSURGICAL) ×1 IMPLANT
GLOVE SURG ENC MOIS LTX SZ7.5 (GLOVE) ×2 IMPLANT
GLOVE SURG UNDER LTX SZ8 (GLOVE) ×2 IMPLANT
GOWN STRL REUS W/ TWL LRG LVL3 (GOWN DISPOSABLE) ×2 IMPLANT
GOWN STRL REUS W/ TWL XL LVL3 (GOWN DISPOSABLE) ×1 IMPLANT
GOWN STRL REUS W/TWL LRG LVL3 (GOWN DISPOSABLE)
GOWN STRL REUS W/TWL XL LVL3 (GOWN DISPOSABLE) ×2
KIT BASIN OR (CUSTOM PROCEDURE TRAY) ×2 IMPLANT
KIT TURNOVER KIT B (KITS) ×2 IMPLANT
NS IRRIG 1000ML POUR BTL (IV SOLUTION) ×2 IMPLANT
PAD ARMBOARD 7.5X6 YLW CONV (MISCELLANEOUS) ×4 IMPLANT
PENCIL SMOKE EVACUATOR (MISCELLANEOUS) ×2 IMPLANT
POUCH SPECIMEN RETRIEVAL 10MM (ENDOMECHANICALS) ×2 IMPLANT
RELOAD 45 VASCULAR/THIN (ENDOMECHANICALS) IMPLANT
RELOAD STAPLE 45 2.5 WHT GRN (ENDOMECHANICALS) IMPLANT
RELOAD STAPLE 45 3.5 BLU ETS (ENDOMECHANICALS) IMPLANT
RELOAD STAPLE TA45 3.5 REG BLU (ENDOMECHANICALS) ×4 IMPLANT
SCISSORS LAP 5X35 DISP (ENDOMECHANICALS) IMPLANT
SET IRRIG TUBING LAPAROSCOPIC (IRRIGATION / IRRIGATOR) ×2 IMPLANT
SET TUBE SMOKE EVAC HIGH FLOW (TUBING) ×2 IMPLANT
SHEARS HARMONIC ACE PLUS 36CM (ENDOMECHANICALS) ×2 IMPLANT
SPECIMEN JAR SMALL (MISCELLANEOUS) ×2 IMPLANT
SUT MNCRL AB 4-0 PS2 18 (SUTURE) ×2 IMPLANT
TOWEL GREEN STERILE (TOWEL DISPOSABLE) ×2 IMPLANT
TOWEL GREEN STERILE FF (TOWEL DISPOSABLE) ×2 IMPLANT
TRAY FOLEY W/BAG SLVR 16FR (SET/KITS/TRAYS/PACK) ×1
TRAY FOLEY W/BAG SLVR 16FR ST (SET/KITS/TRAYS/PACK) ×1 IMPLANT
TRAY LAPAROSCOPIC MC (CUSTOM PROCEDURE TRAY) ×2 IMPLANT
TROCAR ADV FIXATION 5X100MM (TROCAR) ×4 IMPLANT
TROCAR XCEL BLUNT TIP 100MML (ENDOMECHANICALS) ×2 IMPLANT
WATER STERILE IRR 1000ML POUR (IV SOLUTION) ×2 IMPLANT

## 2021-09-24 NOTE — Discharge Instructions (Addendum)
[facility] ° °Please arrive at least 30 min before your appointment to complete your check in paperwork.  If you are unable to arrive 30 min prior to your appointment time we may have to cancel or reschedule you. °LAPAROSCOPIC SURGERY: POST OP INSTRUCTIONS °Always review your discharge instruction sheet given to you by the facility where your surgery was performed. °IF YOU HAVE DISABILITY OR FAMILY LEAVE FORMS, YOU MUST BRING THEM TO THE OFFICE FOR PROCESSING.   °DO NOT GIVE THEM TO YOUR DOCTOR. ° °PAIN CONTROL ° °First take acetaminophen (Tylenol) AND/or ibuprofen (Advil) to control your pain after surgery.  Follow directions on package.  Taking acetaminophen (Tylenol) and/or ibuprofen (Advil) regularly after surgery will help to control your pain and lower the amount of prescription pain medication you may need.  You should not take more than 4,000 mg (4 grams) of acetaminophen (Tylenol) in 24 hours.  You should not take ibuprofen (Advil), aleve, motrin, naprosyn or other NSAIDS if you have a history of stomach ulcers or chronic kidney disease.  °A prescription for pain medication may be given to you upon discharge.  Take your pain medication as prescribed, if you still have uncontrolled pain after taking acetaminophen (Tylenol) or ibuprofen (Advil). °Use ice packs to help control pain. °If you need a refill on your pain medication, please contact your pharmacy.  They will contact our office to request authorization. Prescriptions will not be filled after 5pm or on week-ends. ° °HOME MEDICATIONS °Take your usually prescribed medications unless otherwise directed. ° °DIET °You should follow a light diet the first few days after arrival home.  Be sure to include lots of fluids daily. Avoid fatty, fried foods.  ° °CONSTIPATION °It is common to experience some constipation after surgery and if you are taking pain medication.  Increasing fluid intake and taking a stool softener (such as Colace)  will usually help or prevent this problem from occurring.  A mild laxative (Milk of Magnesia or Miralax) should be taken according to package instructions if there are no bowel movements after 48 hours. ° °WOUND/INCISION CARE °Most patients will experience some swelling and bruising in the area of the incisions.  Ice packs will help.  Swelling and bruising can take several days to resolve.  °Unless discharge instructions indicate otherwise, follow guidelines below  °STERI-STRIPS - you may remove your outer bandages 48 hours after surgery, and you may shower at that time.  You have steri-strips (small skin tapes) in place directly over the incision.  These strips should be left on the skin for 7-10 days.   °DERMABOND/SKIN GLUE - you may shower in 24 hours.  The glue will flake off over the next 2-3 weeks. °Any sutures or staples will be removed at the office during your follow-up visit. ° °ACTIVITIES °You may resume regular (light) daily activities beginning the next day--such as daily self-care, walking, climbing stairs--gradually increasing activities as tolerated.  You may have sexual intercourse when it is comfortable.  Refrain from any heavy lifting or straining until approved by your doctor. °You may drive when you are no longer taking prescription pain medication, you can comfortably wear a seatbelt, and you can safely maneuver your car and apply brakes. ° °FOLLOW-UP °You should see your doctor in the office for a follow-up appointment approximately 2-3 weeks after your surgery.  You should have been given your post-op/follow-up appointment when your surgery was scheduled.  If you did not receive a post-op/follow-up appointment, make sure   that you call for this appointment within a day or two after you arrive home to insure a convenient appointment time. ° ° °WHEN TO CALL YOUR DOCTOR: °Fever over 101.0 °Inability to urinate °Continued bleeding from incision. °Increased pain, redness, or drainage from the  incision. °Increasing abdominal pain ° °The clinic staff is available to answer your questions during regular business hours.  Please don’t hesitate to call and ask to speak to one of the nurses for clinical concerns.  If you have a medical emergency, go to the nearest emergency room or call 911.  A surgeon from [facility] is always on call at the hospital. °[address], [address], [place], [place]  [address] ? [address] [address], [place], [place]   [address] °[phone] ? [phone] ? FAX [phone] ° ° ° ° °Managing Your Pain After Surgery Without Opioids ° ° ° °Thank you for participating in our program to help patients manage their pain after surgery without opioids. This is part of our effort to provide you with the best care possible, without exposing you or your family to the risk that opioids pose. ° °What pain can I expect after surgery? °You can expect to have some pain after surgery. This is normal. The pain is typically worse the day after surgery, and quickly begins to get better. °Many studies have found that many patients are able to manage their pain after surgery with Over-the-Counter (OTC) medications such as Tylenol and Motrin. If you have a condition that does not allow you to take Tylenol or Motrin, notify your surgical team. ° °How will I manage my pain? °The best strategy for controlling your pain after surgery is around the clock pain control with Tylenol (acetaminophen) and Motrin (ibuprofen or Advil). Alternating these medications with each other allows you to maximize your pain control. In addition to Tylenol and Motrin, you can use heating pads or ice packs on your incisions to help reduce your pain. ° °How will I alternate your regular strength over-the-counter pain medication? °You will take a dose of pain medication every three hours. °Start by taking 650 mg of Tylenol (2 pills of 325 mg) °3 hours later take 600 mg of Motrin (3 pills of 200 mg) °3 hours after  taking the Motrin take 650 mg of Tylenol °3 hours after that take 600 mg of Motrin. ° ° °- 1 - ° °See example - if your first dose of Tylenol is at 12:00 PM ° ° °12:00 PM Tylenol 650 mg (2 pills of 325 mg)  °3:00 PM Motrin 600 mg (3 pills of 200 mg)  °6:00 PM Tylenol 650 mg (2 pills of 325 mg)  °9:00 PM Motrin 600 mg (3 pills of 200 mg)  °Continue alternating every 3 hours  ° °We recommend that you follow this schedule around-the-clock for at least 3 days after surgery, or until you feel that it is no longer needed. Use the table on the last page of this handout to keep track of the medications you are taking. °Important: °Do not take more than 3000mg of Tylenol or 3200mg of Motrin in a 24-hour period. °Do not take ibuprofen/Motrin if you have a history of bleeding stomach ulcers, severe kidney disease, &/or actively taking a blood thinner ° °What if I still have pain? °If you have pain that is not controlled with the over-the-counter pain medications (Tylenol and Motrin or Advil) you might have what we call “breakthrough” pain. You will receive a prescription   for a small amount of an opioid pain medication such as Oxycodone, Tramadol, or Tylenol with Codeine. Use these opioid pills in the first 24 hours after surgery if you have breakthrough pain. Do not take more than 1 pill every 4-6 hours. ° °If you still have uncontrolled pain after using all opioid pills, don't hesitate to call our staff using the number provided. We will help make sure you are managing your pain in the best way possible, and if necessary, we can provide a prescription for additional pain medication. ° ° °Day 1   ° °Time  °Name of Medication Number of pills taken  °Amount of Acetaminophen  °Pain Level  ° °Comments  °AM PM       °AM PM       °AM PM       °AM PM       °AM PM       °AM PM       °AM PM       °AM PM       °Total Daily amount of Acetaminophen °Do not take more than  3,000 mg per day    ° ° °Day 2   ° °Time  °Name of Medication  Number of pills °taken  °Amount of Acetaminophen  °Pain Level  ° °Comments  °AM PM       °AM PM       °AM PM       °AM PM       °AM PM       °AM PM       °AM PM       °AM PM       °Total Daily amount of Acetaminophen °Do not take more than  3,000 mg per day    ° ° °Day 3   ° °Time  °Name of Medication Number of pills taken  °Amount of Acetaminophen  °Pain Level  ° °Comments  °AM PM       °AM PM       °AM PM       °AM PM       ° ° ° °AM PM       °AM PM       °AM PM       °AM PM       °Total Daily amount of Acetaminophen °Do not take more than  3,000 mg per day    ° ° °Day 4   ° °Time  °Name of Medication Number of pills taken  °Amount of Acetaminophen  °Pain Level  ° °Comments  °AM PM       °AM PM       °AM PM       °AM PM       °AM PM       °AM PM       °AM PM       °AM PM       °Total Daily amount of Acetaminophen °Do not take more than  3,000 mg per day    ° ° °Day 5   ° °Time  °Name of Medication Number °of pills taken  °Amount of Acetaminophen  °Pain Level  ° °Comments  °AM PM       °AM PM       °AM PM       °AM PM       °AM PM       °AM   PM       °AM PM       °AM PM       °Total Daily amount of Acetaminophen °Do not take more than  3,000 mg per day    ° ° ° °Day 6   ° °Time  °Name of Medication Number of pills °taken  °Amount of Acetaminophen  °Pain Level  °Comments  °AM PM       °AM PM       °AM PM       °AM PM       °AM PM       °AM PM       °AM PM       °AM PM       °Total Daily amount of Acetaminophen °Do not take more than  3,000 mg per day    ° ° °Day 7   ° °Time  °Name of Medication Number of pills taken  °Amount of Acetaminophen  °Pain Level  ° °Comments  °AM PM       °AM PM       °AM PM       °AM PM       °AM PM       °AM PM       °AM PM       °AM PM       °Total Daily amount of Acetaminophen °Do not take more than  3,000 mg per day    ° ° ° ° °For additional information about how and where to safely dispose of unused opioid °medications - https://www.morepowerfulnc.org ° °Disclaimer: This document  contains information and/or instructional materials adapted from [facility] for the typical patient with your condition. It does not replace medical advice from your health care provider because your experience may differ from that of the °typical patient. Talk to your health care provider if you have any questions about this °document, your condition or your treatment plan. °Adapted from [facility] ° °

## 2021-09-24 NOTE — Transfer of Care (Signed)
Immediate Anesthesia Transfer of Care Note ? ?Patient: Adrian Harper ? ?Procedure(s) Performed: APPENDECTOMY LAPAROSCOPIC (Abdomen) ? ?Patient Location: PACU ? ?Anesthesia Type:General ? ?Level of Consciousness: awake and sedated ? ?Airway & Oxygen Therapy: Patient Spontanous Breathing ? ?Post-op Assessment: Report given to RN and Post -op Vital signs reviewed and stable ? ?Post vital signs: Reviewed and stable ? ?Last Vitals:  ?Vitals Value Taken Time  ?BP 115/77   ?Temp 98.1   ?Pulse 81 09/24/21 2330  ?Resp 15 09/24/21 2330  ?SpO2 98 % 09/24/21 2330  ?Vitals shown include unvalidated device data. ? ?Last Pain:  ?Vitals:  ? 09/24/21 1844  ?TempSrc: Oral  ?   ? ?  ? ?Complications: No notable events documented. ?

## 2021-09-24 NOTE — ED Notes (Signed)
Patient going to the OR for a Appendicitis--Going ED to ED [facility] aware--Dr. Annye English accepting  ?

## 2021-09-24 NOTE — ED Notes (Signed)
Pt returned to ED waiting room after contrasted CT exam. Charge RN Azucena Cecil made aware via Epic messenger. -DMG ?

## 2021-09-24 NOTE — Anesthesia Procedure Notes (Signed)
Procedure Name: Intubation ?Date/Time: 09/24/2021 10:28 PM ?Performed by: Molli Hazard, CRNA ?Pre-anesthesia Checklist: Patient identified, Emergency Drugs available, Suction available and Patient being monitored ?Patient Re-evaluated:Patient Re-evaluated prior to induction ?Oxygen Delivery Method: Circle system utilized ?Preoxygenation: Pre-oxygenation with 100% oxygen ?Induction Type: IV induction, Rapid sequence and Cricoid Pressure applied ?Laryngoscope Size: Hyacinth Meeker and 2 ?Grade View: Grade II ?Tube type: Oral ?Tube size: 7.5 mm ?Number of attempts: 1 ?Airway Equipment and Method: Stylet ?Placement Confirmation: ETT inserted through vocal cords under direct vision, positive ETCO2 and breath sounds checked- equal and bilateral ?Secured at: 23 cm ?Tube secured with: Tape ?Dental Injury: Teeth and Oropharynx as per pre-operative assessment  ? ? ? ? ?

## 2021-09-24 NOTE — Op Note (Signed)
Adrian Harper ?[id number] ? ? ?PRE-OPERATIVE DIAGNOSIS:  Acute appendicitis ? ?POST-OPERATIVE DIAGNOSIS:  Acute appendicitis without perforation or abscess ? ?PROCEDURE: Laparoscopic appendectomy ? ?SURGEON:  Stephanie Coup. Cliffton Asters, M.D. ? ?ASSISTANT: OR staff ? ?ANESTHESIA: General endotracheal ? ?EBL:   2 mL ? ?DRAINS: None ? ?SPECIMEN:  Appendix ? ?COUNTS:  Sponge, needle and instrument counts were reported correct x2 at conclusion of the operation ? ?DISPOSITION:  PACU in satisfactory condition ? ?COMPLICATIONS: None ? ?FINDINGS: Acute appendicitis, no evident perforation or abscess. Appendectomy carried out uneventfully. ? ?DESCRIPTION:  ? ?The patient was identified & brought into the operating room. SCDs were in place and functioning. General endotracheal anesthesia was administered. Preoperative antibiotics were administered. The patient was positioned supine with left arm tucked. Hair on the abdomen was then clipped by the OR team. A foley catheter was inserted under sterile conditions. The abdomen was prepped and draped in the standard sterile fashion. A surgical timeout confirmed our plan. ? ?A small incision was made in the infraumbilical skin. The subcutaneous tissue was dissected and the umbilical stalk identified. The stalk was grasped with a Kocher and retracted outwardly. The infraumbilical fascia was exposed and incised. Peritoneal entry was carefully made bluntly. A 0 Vicryl purse-string suture was placed and then the [facility] port was introduced into the abdomen.  CO2 insufflation commenced to . The laparoscope was inserted and confirmed no evidence of trocar site complications. The patient was then positioned in Trendelenburg. Two additional ports were placed - one in left lower quadrant and another in the suprapubic midline taking care to stay well above the bladder - 3 fingerbreadths above the pubic symphysis. The bed was then slightly tilted to place the left side down. ? ?The appendix is  readily identified in the right lower quadrant. Omentum and small bowel are reflected cephalad. The appendix is acutely inflamed in appearance on the body and tip. No evident perforation. No evident abscess. The appendix was then elevated.  The base of the appendix was circumferentially dissected taking care to preserve the cecum free of injury. The base was noted to be viable and healthy appearing. The terminal ileum, cecum and ascending colon also appeared normal. The base of the appendix was then stapled with a blue load, taking a small healthy cuff of viable cecum, taking care to stay clear of the ileocecal valve. The mesoappendix was then ligated by "hugging" the appendix using the harmonic scalpel. The mesoappendix was inspected and noted to be hemostatic. The appendix was placed in an EndoBag. ? ?The right lower quadrant was conservatively irrigated. Hemostasis was noted to be achieved - taking time to inspect the ligated mesoappendix, colon mesentery, and retroperitoneum. Staple line was noted to be intact on the cecum with no bleeding. There was no perforation or injury. The right lower quadrant appeared clean and as such, no drain was placed. ? ?The left lower quadrant and suprapubic ports were removed under direct visualization. The EndoBag was then removed through the umbilical port site and passed off as specimen. The CO2 was exhausted from the abdomen. The umbilical fascia was then closed by closing the 0 Vicryl suture. The fascia was palpated and noted to be completely closed. The skin of all port sites was then approximated using 4-0 Monocryl suture. The incisions were covered with Dermabond. ? ?He was then awakened from general anesthesia, extubated, and transferred to a stretcher for transport to recover in satisfactory condition.  ?

## 2021-09-24 NOTE — H&P (Signed)
CC: Appendicitis  HPI: Adrian Harper is an 41 y.o. male with hx of RLQ pain beginning 2 days ago. Never had this pain before. Mild cramp/ache that has progressed over the last 2 days. Sharper now. No radiation. No aggravating/alleviating factors. Subjective fever on Tuesday to 102F. Decreased appetite but no n/v. No sick contacts.  PMH: Denies  PSH: Denies any prior surgical hx   History reviewed. No pertinent past medical history.  History reviewed. No pertinent surgical history.  No family history on file.  Social:  reports that he has never smoked. He has never used smokeless tobacco. He reports that he does not currently use alcohol. He reports that he does not use drugs. He works in [employer] for [employer]  Allergies: No Known Allergies  Medications: I have reviewed the patient's current medications.  Results for orders placed or performed during the hospital encounter of 09/24/21 (from the past 48 hour(s))  Urinalysis, Routine w reflex microscopic Urine, Clean Catch     Status: None   Collection Time: 09/24/21  6:50 PM  Result Value Ref Range   Color, Urine YELLOW YELLOW   APPearance CLEAR CLEAR   Specific Gravity, Urine 1.011 1.005 - 1.030   pH 6.0 5.0 - 8.0   Glucose, UA NEGATIVE NEGATIVE mg/dL   Hgb urine dipstick NEGATIVE NEGATIVE   Bilirubin Urine NEGATIVE NEGATIVE   Ketones, ur NEGATIVE NEGATIVE mg/dL   Protein, ur NEGATIVE NEGATIVE mg/dL   Nitrite NEGATIVE NEGATIVE   Leukocytes,Ua NEGATIVE NEGATIVE   RBC / HPF 0-5 0 - 5 RBC/hpf   WBC, UA 0-5 0 - 5 WBC/hpf   Mucus PRESENT     Comment: Performed at [facility], [address] [place], [place] [address]  CBC with Differential/Platelet     Status: Abnormal   Collection Time: 09/24/21  6:50 PM  Result Value Ref Range   WBC 9.4 4.0 - 10.5 K/uL   RBC 6.06 (H) 4.22 - 5.81 MIL/uL   Hemoglobin 15.9 13.0 - 17.0 g/dL   HCT 49.1 39.0 - 52.0 %   MCV 81.0 80.0 - 100.0 fL   MCH 26.2 26.0 - 34.0 pg    MCHC 32.4 30.0 - 36.0 g/dL   RDW 12.8 11.5 - 15.5 %   Platelets 233 150 - 400 K/uL   nRBC 0.0 0.0 - 0.2 %   Neutrophils Relative % 67 %   Neutro Abs 6.3 1.7 - 7.7 K/uL   Lymphocytes Relative 20 %   Lymphs Abs 1.9 0.7 - 4.0 K/uL   Monocytes Relative 10 %   Monocytes Absolute 1.0 0.1 - 1.0 K/uL   Eosinophils Relative 2 %   Eosinophils Absolute 0.2 0.0 - 0.5 K/uL   Basophils Relative 1 %   Basophils Absolute 0.1 0.0 - 0.1 K/uL   Immature Granulocytes 0 %   Abs Immature Granulocytes 0.04 0.00 - 0.07 K/uL    Comment: Performed at [facility], [place], [place] [address]  Comprehensive metabolic panel     Status: Abnormal   Collection Time: 09/24/21  6:50 PM  Result Value Ref Range   Sodium 137 135 - 145 mmol/L   Potassium 3.6 3.5 - 5.1 mmol/L   Chloride 99 98 - 111 mmol/L   CO2 30 22 - 32 mmol/L   Glucose, Bld 94 70 - 99 mg/dL    Comment: Glucose reference range applies only to samples taken after fasting for at least 8 hours.   BUN 10  6 - 20 mg/dL   Creatinine, Ser 1.10 0.61 - 1.24 mg/dL   Calcium 9.8 8.9 - 10.3 mg/dL   Total Protein 8.1 6.5 - 8.1 g/dL   Albumin 4.7 3.5 - 5.0 g/dL   AST 31 15 - 41 U/L   ALT 68 (H) 0 - 44 U/L   Alkaline Phosphatase 69 38 - 126 U/L   Total Bilirubin 1.3 (H) 0.3 - 1.2 mg/dL   GFR, Estimated >60 >60 mL/min    Comment: (NOTE) Calculated using the CKD-EPI Creatinine Equation (2021)    Anion gap 8 5 - 15    Comment: Performed at [facility], [address] [place], [place] [address]    CT ABDOMEN PELVIS W CONTRAST  Result Date: 09/24/2021 CLINICAL DATA:  Right lower quadrant abdominal pain. EXAM: CT ABDOMEN AND PELVIS WITH CONTRAST TECHNIQUE: Multidetector CT imaging of the abdomen and pelvis was performed using the standard protocol following bolus administration of intravenous contrast. RADIATION DOSE REDUCTION: This exam was performed according to the departmental dose-optimization  program which includes automated exposure control, adjustment of the mA and/or kV according to patient size and/or use of iterative reconstruction technique. CONTRAST:  163mL OMNIPAQUE IOHEXOL 300 MG/ML  SOLN COMPARISON:  CT dated 07/01/2018. FINDINGS: Lower chest: The visualized lung bases are clear. No intra-abdominal free air or free fluid. Hepatobiliary: No focal liver abnormality is seen. No gallstones, gallbladder wall thickening, or biliary dilatation. Pancreas: Unremarkable. No pancreatic ductal dilatation or surrounding inflammatory changes. Spleen: Normal in size without focal abnormality. Adrenals/Urinary Tract: Adrenal glands are unremarkable. Kidneys are normal, without renal calculi, focal lesion, or hydronephrosis. Bladder is unremarkable. Stomach/Bowel: No bowel obstruction. The appendix is mildly enlarged measuring approximately 1 cm in thickness. There is mild peritendinous seal haziness. Findings concerning for an early acute appendicitis. No drainable fluid collection or abscess. The appendix is located in the right lower quadrant medial to the cecum and anterior to the right psoas muscle. Vascular/Lymphatic: The abdominal aorta and IVC are unremarkable. No portal venous gas. There is no adenopathy. Reproductive: The prostate and seminal vesicles are grossly unremarkable. No pelvic mass. Other: Small fat containing umbilical hernia. Musculoskeletal: No acute or significant osseous findings. IMPRESSION: Findings concerning for an early acute appendicitis. No drainable fluid collection or abscess. Electronically Signed   By: Anner Crete M.D.   On: 09/24/2021 20:21    ROS - all of the below systems have been reviewed with the patient and positives are indicated with bold text General: chills, fever or night sweats Eyes: blurry vision or double vision ENT: epistaxis or sore throat Allergy/Immunology: itchy/watery eyes or nasal congestion Hematologic/Lymphatic: bleeding problems, blood  clots or swollen lymph nodes Endocrine: temperature intolerance or unexpected weight changes Breast: new or changing breast lumps or nipple discharge Resp: cough, shortness of breath, or wheezing CV: chest pain or dyspnea on exertion GI: as per HPI GU: dysuria, trouble voiding, or hematuria MSK: joint pain or joint stiffness Neuro: TIA or stroke symptoms Derm: pruritus and skin lesion changes Psych: anxiety and depression  PE Blood pressure 120/82, pulse 72, temperature 98.3 F (36.8 C), temperature source Oral, resp. rate 18, height 5\' 9"  (1.753 m), weight 97.5 kg, SpO2 98 %. Constitutional: NAD; conversant; no deformities Eyes: Moist conjunctiva; no lid lag; anicteric; PERRL Neck: Trachea midline; no thyromegaly Lungs: Normal respiratory effort; no tactile fremitus CV: RRR; no palpable thrills; no pitting edema GI: Abd soft, moderately ttp in RLQ; no rebound/guarding; nondistended; no palpable hepatosplenomegaly MSK: Normal range  of motion of extremities; no clubbing/cyanosis Psychiatric: Appropriate affect; alert and oriented x3 Lymphatic: No palpable cervical or axillary lymphadenopathy  Results for orders placed or performed during the hospital encounter of 09/24/21 (from the past 48 hour(s))  Urinalysis, Routine w reflex microscopic Urine, Clean Catch     Status: None   Collection Time: 09/24/21  6:50 PM  Result Value Ref Range   Color, Urine YELLOW YELLOW   APPearance CLEAR CLEAR   Specific Gravity, Urine 1.011 1.005 - 1.030   pH 6.0 5.0 - 8.0   Glucose, UA NEGATIVE NEGATIVE mg/dL   Hgb urine dipstick NEGATIVE NEGATIVE   Bilirubin Urine NEGATIVE NEGATIVE   Ketones, ur NEGATIVE NEGATIVE mg/dL   Protein, ur NEGATIVE NEGATIVE mg/dL   Nitrite NEGATIVE NEGATIVE   Leukocytes,Ua NEGATIVE NEGATIVE   RBC / HPF 0-5 0 - 5 RBC/hpf   WBC, UA 0-5 0 - 5 WBC/hpf   Mucus PRESENT     Comment: Performed at [facility], [address], [place], [place] [address]   CBC with Differential/Platelet     Status: Abnormal   Collection Time: 09/24/21  6:50 PM  Result Value Ref Range   WBC 9.4 4.0 - 10.5 K/uL   RBC 6.06 (H) 4.22 - 5.81 MIL/uL   Hemoglobin 15.9 13.0 - 17.0 g/dL   HCT 49.1 39.0 - 52.0 %   MCV 81.0 80.0 - 100.0 fL   MCH 26.2 26.0 - 34.0 pg   MCHC 32.4 30.0 - 36.0 g/dL   RDW 12.8 11.5 - 15.5 %   Platelets 233 150 - 400 K/uL   nRBC 0.0 0.0 - 0.2 %   Neutrophils Relative % 67 %   Neutro Abs 6.3 1.7 - 7.7 K/uL   Lymphocytes Relative 20 %   Lymphs Abs 1.9 0.7 - 4.0 K/uL   Monocytes Relative 10 %   Monocytes Absolute 1.0 0.1 - 1.0 K/uL   Eosinophils Relative 2 %   Eosinophils Absolute 0.2 0.0 - 0.5 K/uL   Basophils Relative 1 %   Basophils Absolute 0.1 0.0 - 0.1 K/uL   Immature Granulocytes 0 %   Abs Immature Granulocytes 0.04 0.00 - 0.07 K/uL    Comment: Performed at [facility], [place], [place] [address]  Comprehensive metabolic panel     Status: Abnormal   Collection Time: 09/24/21  6:50 PM  Result Value Ref Range   Sodium 137 135 - 145 mmol/L   Potassium 3.6 3.5 - 5.1 mmol/L   Chloride 99 98 - 111 mmol/L   CO2 30 22 - 32 mmol/L   Glucose, Bld 94 70 - 99 mg/dL    Comment: Glucose reference range applies only to samples taken after fasting for at least 8 hours.   BUN 10 6 - 20 mg/dL   Creatinine, Ser 1.10 0.61 - 1.24 mg/dL   Calcium 9.8 8.9 - 10.3 mg/dL   Total Protein 8.1 6.5 - 8.1 g/dL   Albumin 4.7 3.5 - 5.0 g/dL   AST 31 15 - 41 U/L   ALT 68 (H) 0 - 44 U/L   Alkaline Phosphatase 69 38 - 126 U/L   Total Bilirubin 1.3 (H) 0.3 - 1.2 mg/dL   GFR, Estimated >60 >60 mL/min    Comment: (NOTE) Calculated using the CKD-EPI Creatinine Equation (2021)    Anion gap 8 5 - 15    Comment: Performed at [facility], [address] [place], [place] [address]    CT ABDOMEN  PELVIS W CONTRAST  Result Date: 09/24/2021 CLINICAL DATA:  Right lower quadrant abdominal pain. EXAM: CT  ABDOMEN AND PELVIS WITH CONTRAST TECHNIQUE: Multidetector CT imaging of the abdomen and pelvis was performed using the standard protocol following bolus administration of intravenous contrast. RADIATION DOSE REDUCTION: This exam was performed according to the departmental dose-optimization program which includes automated exposure control, adjustment of the mA and/or kV according to patient size and/or use of iterative reconstruction technique. CONTRAST:  122mL OMNIPAQUE IOHEXOL 300 MG/ML  SOLN COMPARISON:  CT dated 07/01/2018. FINDINGS: Lower chest: The visualized lung bases are clear. No intra-abdominal free air or free fluid. Hepatobiliary: No focal liver abnormality is seen. No gallstones, gallbladder wall thickening, or biliary dilatation. Pancreas: Unremarkable. No pancreatic ductal dilatation or surrounding inflammatory changes. Spleen: Normal in size without focal abnormality. Adrenals/Urinary Tract: Adrenal glands are unremarkable. Kidneys are normal, without renal calculi, focal lesion, or hydronephrosis. Bladder is unremarkable. Stomach/Bowel: No bowel obstruction. The appendix is mildly enlarged measuring approximately 1 cm in thickness. There is mild peritendinous seal haziness. Findings concerning for an early acute appendicitis. No drainable fluid collection or abscess. The appendix is located in the right lower quadrant medial to the cecum and anterior to the right psoas muscle. Vascular/Lymphatic: The abdominal aorta and IVC are unremarkable. No portal venous gas. There is no adenopathy. Reproductive: The prostate and seminal vesicles are grossly unremarkable. No pelvic mass. Other: Small fat containing umbilical hernia. Musculoskeletal: No acute or significant osseous findings. IMPRESSION: Findings concerning for an early acute appendicitis. No drainable fluid collection or abscess. Electronically Signed   By: Anner Crete M.D.   On: 09/24/2021 20:21    I have personally reviewed the relevant  CT A/P dated 09/24/21; bmp & cbc dated today  A/P: Adrian Harper is an 41 y.o. male with acute appendicitis without evidence of perforation or abscess  -The anatomy and physiology of the GI tract was discussed at length with the patient and his wife. The pathophysiology of appendicitis was discussed as well. -We reviewed options moving forward for treatment, covering IV abx vs surgery. We discussed that with antibiotics alone, there is reasonable success in managing appendicitis, however, risks of recurrence at 60yrs being as high as 40% in some studies. We discussed appendectomy - laparoscopic and potential open techniques as well as scenarios where an ileocecectomy could be necessary. We discussed the material risks (including, but not limited to, pain, bleeding, infection, scarring, need for blood transfusion, damage to surrounding structures- blood vessels/nerves/viscus/organs, damage to ureter/bladder, urine leak, leak from staple line, need for additional procedures, hernia, recurrence although quite low, pneumonia, heart attack, stroke, death) benefits and alternatives to surgery were discussed. The patient's questions were answered to his satisfaction, he voiced understanding and elected to proceed with surgery. Additionally, we discussed typical postoperative expectations and the recovery process.  I spent a total of 55 minutes in both face-to-face and non-face-to-face activities, excluding procedures performed, for this visit on the date of this encounter.  H&P Moderate MDM  Nadeen Landau, MD [facility], [place]

## 2021-09-24 NOTE — Anesthesia Preprocedure Evaluation (Addendum)
Anesthesia Evaluation  ?Patient identified by MRN, date of birth, ID band ?Patient awake ? ? ? ?Reviewed: ?Allergy & Precautions, NPO status , Patient's Chart, lab work & pertinent test results ? ?History of Anesthesia Complications ?Negative for: history of anesthetic complications ? ?Airway ?Mallampati: III ? ?TM Distance: >3 FB ?Neck ROM: Full ? ? ? Dental ? ?(+) Dental Advisory Given, Teeth Intact ?  ?Pulmonary ?sleep apnea and Continuous Positive Airway Pressure Ventilation ,  ?  ?breath sounds clear to auscultation ? ? ? ? ? ? Cardiovascular ?(-) anginanegative cardio ROS ? ? ?Rhythm:Regular Rate:Normal ? ? ?  ?Neuro/Psych ?negative neurological ROS ?   ? GI/Hepatic ?Neg liver ROS, abd pain with acute appy ?  ?Endo/Other  ?obese ? Renal/GU ?negative Renal ROS  ? ?  ?Musculoskeletal ? ? Abdominal ?(+) + obese,   ?Peds ? Hematology ?negative hematology ROS ?(+)   ?Anesthesia Other Findings ? ? Reproductive/Obstetrics ? ?  ? ? ? ? ? ? ? ? ? ? ? ? ? ?  ?  ? ? ? ? ? ? ? ?Anesthesia Physical ?Anesthesia Plan ? ?ASA: 3 ? ?Anesthesia Plan: General  ? ?Post-op Pain Management: Ofirmev IV (intra-op)* and Toradol IV (intra-op)*  ? ?Induction: Intravenous and Rapid sequence ? ?PONV Risk Score and Plan: 2 and Ondansetron and Dexamethasone ? ?Airway Management Planned: Oral ETT ? ?Additional Equipment: None ? ?Intra-op Plan:  ? ?Post-operative Plan: Extubation in OR ? ?Informed Consent: I have reviewed the patients History and Physical, chart, labs and discussed the procedure including the risks, benefits and alternatives for the proposed anesthesia with the patient or authorized representative who has indicated his/her understanding and acceptance.  ? ? ? ?Dental advisory given ? ?Plan Discussed with: CRNA and Surgeon ? ?Anesthesia Plan Comments:   ? ? ? ? ? ?Anesthesia Quick Evaluation ? ?

## 2021-09-24 NOTE — ED Provider Notes (Signed)
?[facility] EM[facility] DEPT ?Provider Note ? ? ?CSN: [id number] ?Arrival date & time: 09/24/21  1811 ? ?  ? ?History ? ?Chief Complaint  ?Patient presents with  ? Abdominal Pain  ? ? ?Adrian Harper is a 41 y.o. male. ? ?Patient is a healthy 41 year old male with no significant medical history who is presenting today from his doctor's office due to right lower quadrant pain.  He reports his symptoms started on Tuesday.  He just was not feeling well and had a mild achiness in the lower part of his abdomen.  By Tuesday evening when he got off work he had spiked a temperature up to 102 and was having worsening pain.  His wife gave him fever reducer and he reports since that time he has had no further fever but he continues to have abdominal pain.  His bowel movements have been normal.  He has had a decreased appetite and so he went to see his doctor today.  He denies any urinary symptoms.  No cough or congestion.  No prior abdominal surgeries. ? ?The history is provided by the patient.  ?Abdominal Pain ? ?  ? ?Home Medications ?Prior to Admission medications   ?Medication Sig Start Date End Date Taking? Authorizing Provider  ?modafinil (PROVIGIL) 200 MG tablet TAKE 1 TABLET BY MOUTH EVERY DAY IN THE MORNING 03/26/19   [provider]  ?   ? ?Allergies    ?Patient has no known allergies.   ? ?Review of Systems   ?Review of Systems  ?Gastrointestinal:  Positive for abdominal pain.  ? ?Physical Exam ?Updated Vital Signs ?BP 120/82   Pulse 72   Temp 98.3 ?F (36.8 ?C) (Oral)   Resp 18   Ht 5\' 9"  (1.753 m)   Wt 97.5 kg   SpO2 98%   BMI 31.74 kg/m?  ?Physical Exam ?Vitals and nursing note reviewed.  ?Constitutional:   ?   General: He is not in acute distress. ?   Appearance: He is well-developed.  ?HENT:  ?   Head: Normocephalic and atraumatic.  ?Eyes:  ?   Conjunctiva/sclera: Conjunctivae normal.  ?   Pupils: Pupils are equal, round, and reactive to light.  ?Cardiovascular:  ?   Rate and Rhythm:  Normal rate and regular rhythm.  ?   Heart sounds: No murmur heard. ?Pulmonary:  ?   Effort: Pulmonary effort is normal. No respiratory distress.  ?   Breath sounds: Normal breath sounds. No wheezing or rales.  ?Abdominal:  ?   General: There is no distension.  ?   Palpations: Abdomen is soft.  ?   Tenderness: There is abdominal tenderness in the right lower quadrant. There is guarding. There is no right CVA tenderness, left CVA tenderness or rebound.  ?Musculoskeletal:     ?   General: No tenderness. Normal range of motion.  ?   Cervical back: Normal range of motion and neck supple.  ?Skin: ?   General: Skin is warm and dry.  ?   Findings: No erythema or rash.  ?Neurological:  ?   Mental Status: He is alert and oriented to person, place, and time.  ?Psychiatric:     ?   Behavior: Behavior normal.  ? ? ?ED Results / Procedures / Treatments   ?Labs ?(all labs ordered are listed, but only abnormal results are displayed) ?Labs Reviewed  ?CBC WITH DIFFERENTIAL/PLATELET - Abnormal; Notable for the following components:  ?    Result Value  ? RBC 6.06 (*)   ?  All other components within normal limits  ?COMPREHENSIVE METABOLIC PANEL - Abnormal; Notable for the following components:  ? ALT 68 (*)   ? Total Bilirubin 1.3 (*)   ? All other components within normal limits  ?RESP PANEL BY RT-PCR (FLU A&B, COVID) ARPGX2  ?URINALYSIS, ROUTINE W REFLEX MICROSCOPIC  ? ? ?EKG ?None ? ?Radiology ?CT ABDOMEN PELVIS W CONTRAST ? ?Result Date: 09/24/2021 ?CLINICAL DATA:  Right lower quadrant abdominal pain. EXAM: CT ABDOMEN AND PELVIS WITH CONTRAST TECHNIQUE: Multidetector CT imaging of the abdomen and pelvis was performed using the standard protocol following bolus administration of intravenous contrast. RADIATION DOSE REDUCTION: This exam was performed according to the departmental dose-optimization program which includes automated exposure control, adjustment of the mA and/or kV according to patient size and/or use of iterative  reconstruction technique. CONTRAST:  OMNIPAQUE IOHEXOL 300 MG/ML  SOLN COMPARISON:  CT dated 07/01/2018. FINDINGS: Lower chest: The visualized lung bases are clear. No intra-abdominal free air or free fluid. Hepatobiliary: No focal liver abnormality is seen. No gallstones, gallbladder wall thickening, or biliary dilatation. Pancreas: Unremarkable. No pancreatic ductal dilatation or surrounding inflammatory changes. Spleen: Normal in size without focal abnormality. Adrenals/Urinary Tract: Adrenal glands are unremarkable. Kidneys are normal, without renal calculi, focal lesion, or hydronephrosis. Bladder is unremarkable. Stomach/Bowel: No bowel obstruction. The appendix is mildly enlarged measuring approximately 1 cm in thickness. There is mild peritendinous seal haziness. Findings concerning for an early acute appendicitis. No drainable fluid collection or abscess. The appendix is located in the right lower quadrant medial to the cecum and anterior to the right psoas muscle. Vascular/Lymphatic: The abdominal aorta and IVC are unremarkable. No portal venous gas. There is no adenopathy. Reproductive: The prostate and seminal vesicles are grossly unremarkable. No pelvic mass. Other: Small fat containing umbilical hernia. Musculoskeletal: No acute or significant osseous findings. IMPRESSION: Findings concerning for an early acute appendicitis. No drainable fluid collection or abscess. Electronically Signed   By: Elgie Collard M.D.   On: 09/24/2021 20:21   ? ?Procedures ?Procedures  ? ? ?Medications Ordered in ED ?Medications  ?cefTRIAXone (ROCEPHIN) 2 g in sodium chloride 0.9 % 100 mL IVPB (has no administration in time range)  ?  And  ?metroNIDAZOLE (FLAGYL) IVPB 500 mg (has no administration in time range)  ?iohexol (OMNIPAQUE) 300 MG/ML solution 100 mL (100 mLs Intravenous Contrast Given 09/24/21 2012)  ? ? ?ED Course/ Medical Decision Making/ A&P ?  ?                        ?Medical Decision Making ?Amount  and/or Complexity of Data Reviewed ?Labs: ordered. ?Radiology: ordered. ? ?Risk ?Prescription drug management. ? ? ?Patient presenting from PCPs office today due to persistent right lower quadrant pain and fever earlier this week.  Patient does have right lower quadrant pain with minimal guarding on exam.  He has no flank pain or urinary symptoms and low suspicion for renal stone, UTI, pyelonephritis.  He has no upper abdominal pain concerning for hepatitis, pancreatitis or cholelithiasis.  He is afebrile here and well-appearing.  Concern for appendicitis versus mesenteric adenitis with a viral syndrome.  Labs and CT are pending. ? ?8:45 PM ?I independently visualized and interpreted patient's EKG it appears that he might have appendicitis.  Radiology reported early appendicitis without complications.  Spoke with Dr. Cliffton Asters with general surgery and patient will need an appendectomy.  He is excepting the patient.  Patient will transfer to the emergency room  at [facility] by private vehicle.  He will need to receive his antibiotics when he gets there.  He has remained n.p.o. patient meets criteria for admission today.  All the findings were discussed with him.  His questions were answered ? ? ? ? ? ? ? ?Final Clinical Impression(s) / ED Diagnoses ?Final diagnoses:  ?Acute appendicitis with localized peritonitis, without perforation, abscess, or gangrene  ? ? ?Rx / DC Orders ?ED Discharge Orders   ? ? None  ? ?  ? ? ?  ?Gwyneth Sprout, MD ?09/24/21 2047 ? ?

## 2021-09-24 NOTE — ED Notes (Signed)
Provider requested to hold off on the antibiotics until he arrived over at [facility] ED. Antibiotics were opened, not used and thrown away... IV was wrapped with coban and left in his RAC. ?

## 2021-09-24 NOTE — ED Triage Notes (Signed)
Patient here POV from Home with ABD Pain. ? ?Patient endorses ABD Pain since Tuesday and has associated symptoms such as Nausea, Fevers.  ? ?Pain is RLQ. Fever of 103 Tuesday PM. No Emesis. ? ?NAD Noted during Triage. A&Ox4. GCS 15. Ambulatory. MD at Bedside during Triage. ?

## 2021-09-24 NOTE — ED Notes (Signed)
Report was given to the Charge at [facility].  ?

## 2021-09-25 ENCOUNTER — Encounter (HOSPITAL_COMMUNITY): Payer: Self-pay | Admitting: Surgery

## 2021-09-25 DIAGNOSIS — Z9049 Acquired absence of other specified parts of digestive tract: Secondary | ICD-10-CM

## 2021-09-25 MED ORDER — TRAMADOL HCL 50 MG PO TABS: 50.0000 mg | ORAL_TABLET | Freq: Four times a day (QID) | ORAL | 0 refills | Status: DC | PRN

## 2021-09-25 MED ORDER — HEPARIN SODIUM (PORCINE) 5000 UNIT/ML IJ SOLN
5000.0000 [IU] | Freq: Three times a day (TID) | INTRAMUSCULAR | Status: DC
  Administered 2021-09-25 (×2): 5000 [IU] via SUBCUTANEOUS
  Filled 2021-09-25 (×2): qty 1

## 2021-09-25 MED ORDER — HYDRALAZINE HCL 20 MG/ML IJ SOLN: 10.0000 mg | INTRAMUSCULAR | Status: DC | PRN

## 2021-09-25 MED ORDER — DIPHENHYDRAMINE HCL 50 MG/ML IJ SOLN: 12.5000 mg | Freq: Four times a day (QID) | INTRAMUSCULAR | Status: DC | PRN

## 2021-09-25 MED ORDER — LACTATED RINGERS IV SOLN: INTRAVENOUS | Status: DC

## 2021-09-25 MED ORDER — ACETAMINOPHEN 500 MG PO TABS
1000.0000 mg | ORAL_TABLET | Freq: Four times a day (QID) | ORAL | Status: DC
  Administered 2021-09-25 (×3): 1000 mg via ORAL
  Filled 2021-09-25 (×3): qty 2

## 2021-09-25 MED ORDER — TRAMADOL HCL 50 MG PO TABS
50.0000 mg | ORAL_TABLET | Freq: Four times a day (QID) | ORAL | Status: DC | PRN
  Administered 2021-09-25: 50 mg via ORAL
  Filled 2021-09-25: qty 1

## 2021-09-25 MED ORDER — SIMETHICONE 80 MG PO CHEW
40.0000 mg | CHEWABLE_TABLET | Freq: Four times a day (QID) | ORAL | Status: DC | PRN
  Administered 2021-09-25: 40 mg via ORAL
  Filled 2021-09-25: qty 1

## 2021-09-25 MED ORDER — ONDANSETRON HCL 4 MG/2ML IJ SOLN: 4.0000 mg | Freq: Four times a day (QID) | INTRAMUSCULAR | Status: DC | PRN

## 2021-09-25 MED ORDER — ONDANSETRON 4 MG PO TBDP
4.0000 mg | ORAL_TABLET | Freq: Four times a day (QID) | ORAL | Status: DC | PRN
Start: 2021-09-25 — End: 2021-09-25

## 2021-09-25 MED ORDER — DIPHENHYDRAMINE HCL 12.5 MG/5ML PO ELIX: 12.5000 mg | ORAL_SOLUTION | Freq: Four times a day (QID) | ORAL | Status: DC | PRN

## 2021-09-25 MED ORDER — ACETAMINOPHEN 500 MG PO TABS: 1000.0000 mg | ORAL_TABLET | Freq: Three times a day (TID) | ORAL | 0 refills | Status: DC | PRN

## 2021-09-25 MED ORDER — HYDROMORPHONE HCL 1 MG/ML IJ SOLN: 0.5000 mg | INTRAMUSCULAR | Status: DC | PRN

## 2021-09-25 MED ORDER — IBUPROFEN 200 MG PO TABS
600.0000 mg | ORAL_TABLET | Freq: Four times a day (QID) | ORAL | Status: DC | PRN
  Administered 2021-09-25: 600 mg via ORAL
  Filled 2021-09-25: qty 3

## 2021-09-25 NOTE — Discharge Summary (Signed)
? ? ?  Patient ID: ?Adrian Harper ?[id number] ?1981-02-06 41 y.o. ? ?Admit date: 09/24/2021 ?Discharge date: 09/25/2021 ? ?Admitting Diagnosis: ?Acute appendicitis  ? ?Discharge Diagnosis ?Acute appendicitis without perforation or abscess ? ?Consultants ?None ? ?Reason for Admission: ?Adrian Harper is an 41 y.o. male with hx of RLQ pain beginning 2 days ago. Never had this pain before. Mild cramp/ache that has progressed over the last 2 days. Sharper now. No radiation. No aggravating/alleviating factors. Subjective fever on Tuesday to 102F. Decreased appetite but no n/v. No sick contacts. ? ?Procedures ?Dr. Cliffton Asters - Laparoscopic appendectomy - 09/24/2021 ? ?Hospital Course:  ?The patient was admitted and underwent a laparoscopic appendectomy.  The patient tolerated the procedure well.  On POD 1, the patient was tolerating a diet, voiding well, mobilizing, VSS and pain was controlled with oral pain medications.  The patient was stable for DC home at this time with appropriate follow up made. Discharge and return precautions were discussed. A note was provided for work.  ? ?Physical Exam: ?Gen:  Alert, NAD, pleasant ?HEENT: EOM's intact, pupils equal and round ?Card:  RRR, no M/G/R heard ?Pulm:  CTAB, no W/R/R, effort normal ?Abd: Soft, ND, appropriately tender around laparoscopic incisions - otherwise NT. +BS. Incisions with glue intact appears well and are without drainage, bleeding, or signs of infection ?Ext:  No LE edema  ?Psych: A&Ox3  ?Skin: no rashes noted, warm and dry ? ?Allergies as of 09/25/2021   ?No Known Allergies ?  ? ?  ?Medication List  ?  ? ?TAKE these medications   ? ?acetaminophen 500 MG tablet ?Commonly known as: TYLENOL ?Take 2 tablets (1,000 mg total) by mouth every 8 (eight) hours as needed. ?  ?modafinil 200 MG tablet ?Commonly known as: PROVIGIL ?TAKE 1 TABLET BY MOUTH EVERY DAY IN THE MORNING ?  ?traMADol 50 MG tablet ?Commonly known as: ULTRAM ?Take 1 tablet (50 mg total) by mouth every 6 (six) hours  as needed. ?  ? ?  ? ? ? ? Follow-up Information   ? ? [facility], [place]. Call in 1 day(s).   ?Specialty: General Surgery ?Why: Please call to confirm your appointment date and time. We are working hard to make this for you. Please bring a copy of your photo ID and insurance card. Please arrive 30 minutes prior to your appointment for paperwork. ?Contact information: ?[address] ?[address] ?[place] [address] ?[phone] ? ?  ?  ? ?  ?  ? ?  ? ? ?Signed: ?Leary Roca, PA-C ?[facility] [place] [facility] ?09/25/2021, 9:00 AM ?Please see Amion for pager number during day hours 7:00am-4:30pm ? ?

## 2021-09-25 NOTE — Anesthesia Postprocedure Evaluation (Signed)
Anesthesia Post Note ? ?Patient: Adrian Harper ? ?Procedure(s) Performed: APPENDECTOMY LAPAROSCOPIC (Abdomen) ? ?  ? ?Patient location during evaluation: PACU ?Anesthesia Type: General ?Level of consciousness: awake and alert, patient cooperative and oriented ?Pain management: pain level controlled ?Vital Signs Assessment: post-procedure vital signs reviewed and stable ?Respiratory status: spontaneous breathing, nonlabored ventilation and respiratory function stable ?Cardiovascular status: blood pressure returned to baseline and stable ?Postop Assessment: no apparent nausea or vomiting ?Anesthetic complications: no ? ? ?No notable events documented. ? ?Last Vitals:  ?Vitals:  ? 09/25/21 0000 09/25/21 0015  ?BP: 113/74 113/80  ?Pulse: 74 77  ?Resp: 15 14  ?Temp:    ?SpO2: 97% 98%  ?  ?Last Pain:  ?Vitals:  ? 09/25/21 0000  ?TempSrc:   ?PainSc: 0-No pain  ? ? ?  ?  ?  ?  ?  ?  ? ?Acire Tang,E. Vishruth Seoane ? ? ? ? ?

## 2021-09-25 NOTE — TOC Progression Note (Signed)
Transition of Care (TOC) - Progression Note  ? ? ?Patient Details  ?Name: Adrian Harper ?MRN: [id number] ?Date of Birth: [birth year] ? ?Transition of Care (TOC) CM/SW Contact  ?Beckie Busing, RN ?Phone Number:[phone] ? ?09/25/2021, 8:16 AM ? ?Clinical Narrative:    ? ?Transition of Care (TOC) Screening Note ? ? ?Patient Details  ?Name: Adrian Harper ?Date of Birth: 01/24/1981 ? ? ?Transition of Care (TOC) CM/SW Contact:    ?Beckie Busing, RN ?Phone Number: ?09/25/2021, 8:16 AM ? ? ? ?Transition of Care Department [facility]) has reviewed patient and no TOC needs have been identified at this time. We will continue to monitor patient advancement through interdisciplinary progression rounds. If new patient transition needs arise, please place a TOC consult. ? ? ? ? ?  ?  ? ?Expected Discharge Plan and Services ?  ?  ?  ?  ?  ?                ?  ?  ?  ?  ?  ?  ?  ?  ?  ?  ? ? ?Social Determinants of Health (SDOH) Interventions ?  ? ?Readmission Risk Interventions ?No flowsheet data found. ? ?

## 2021-09-29 LAB — SURGICAL PATHOLOGY

## 2022-03-27 ENCOUNTER — Other Ambulatory Visit: Payer: Self-pay

## 2022-03-27 ENCOUNTER — Emergency Department (HOSPITAL_COMMUNITY)
Admission: EM | Admit: 2022-03-27 | Discharge: 2022-03-27 | Disposition: A | Discharge status: Home / Self Care | Payer: 59 | Attending: Emergency Medicine | Admitting: Emergency Medicine

## 2022-03-27 ENCOUNTER — Emergency Department (HOSPITAL_COMMUNITY): Payer: 59

## 2022-03-27 ENCOUNTER — Encounter (HOSPITAL_COMMUNITY): Payer: Self-pay

## 2022-03-27 DIAGNOSIS — M25521 Pain in right elbow: Secondary | ICD-10-CM

## 2022-03-27 DIAGNOSIS — R1031 Right lower quadrant pain: Secondary | ICD-10-CM

## 2022-03-27 DIAGNOSIS — M79604 Pain in right leg: Secondary | ICD-10-CM | POA: Diagnosis not present

## 2022-03-27 DIAGNOSIS — Y92410 Unspecified street and highway as the place of occurrence of the external cause: Secondary | ICD-10-CM | POA: Insufficient documentation

## 2022-03-27 DIAGNOSIS — S5012XA Contusion of left forearm, initial encounter: Secondary | ICD-10-CM | POA: Diagnosis not present

## 2022-03-27 DIAGNOSIS — Z20822 Contact with and (suspected) exposure to covid-19: Secondary | ICD-10-CM | POA: Insufficient documentation

## 2022-03-27 DIAGNOSIS — S0990XA Unspecified injury of head, initial encounter: Secondary | ICD-10-CM | POA: Insufficient documentation

## 2022-03-27 DIAGNOSIS — R10817 Generalized abdominal tenderness: Secondary | ICD-10-CM | POA: Insufficient documentation

## 2022-03-27 DIAGNOSIS — R079 Chest pain, unspecified: Secondary | ICD-10-CM | POA: Diagnosis not present

## 2022-03-27 DIAGNOSIS — S161XXA Strain of muscle, fascia and tendon at neck level, initial encounter: Secondary | ICD-10-CM | POA: Insufficient documentation

## 2022-03-27 DIAGNOSIS — M542 Cervicalgia: Secondary | ICD-10-CM | POA: Insufficient documentation

## 2022-03-27 DIAGNOSIS — R911 Solitary pulmonary nodule: Secondary | ICD-10-CM | POA: Insufficient documentation

## 2022-03-27 DIAGNOSIS — S199XXA Unspecified injury of neck, initial encounter: Secondary | ICD-10-CM | POA: Diagnosis present

## 2022-03-27 DIAGNOSIS — M79671 Pain in right foot: Secondary | ICD-10-CM | POA: Diagnosis not present

## 2022-03-27 DIAGNOSIS — M25561 Pain in right knee: Secondary | ICD-10-CM | POA: Insufficient documentation

## 2022-03-27 DIAGNOSIS — R7309 Other abnormal glucose: Secondary | ICD-10-CM | POA: Insufficient documentation

## 2022-03-27 DIAGNOSIS — M7918 Myalgia, other site: Secondary | ICD-10-CM

## 2022-03-27 LAB — COMPREHENSIVE METABOLIC PANEL
ALT: 35 U/L (ref 0–44)
AST: 26 U/L (ref 15–41)
Albumin: 4 g/dL (ref 3.5–5.0)
Alkaline Phosphatase: 70 U/L (ref 38–126)
Anion gap: 5 (ref 5–15)
BUN: 10 mg/dL (ref 6–20)
CO2: 27 mmol/L (ref 22–32)
Calcium: 9.1 mg/dL (ref 8.9–10.3)
Chloride: 107 mmol/L (ref 98–111)
Creatinine, Ser: 1.24 mg/dL (ref 0.61–1.24)
GFR, Estimated: >60 mL/min (ref >60)
Glucose, Bld: 92 mg/dL (ref 70–99)
Potassium: 3.7 mmol/L (ref 3.5–5.1)
Sodium: 139 mmol/L (ref 135–145)
Total Bilirubin: 1.1 mg/dL (ref 0.3–1.2)
Total Protein: 7 g/dL (ref 6.5–8.1)

## 2022-03-27 LAB — CBC
HCT: 45.1 % (ref 39.0–52.0)
Hemoglobin: 15.1 g/dL (ref 13.0–17.0)
MCH: 27 pg (ref 26.0–34.0)
MCHC: 33.5 g/dL (ref 30.0–36.0)
MCV: 80.7 fL (ref 80.0–100.0)
Platelets: 221 10*3/uL (ref 150–400)
RBC: 5.59 MIL/uL (ref 4.22–5.81)
RDW: 12.5 % (ref 11.5–15.5)
WBC: 7.7 10*3/uL (ref 4.0–10.5)
nRBC: 0 % (ref 0.0–0.2)

## 2022-03-27 LAB — I-STAT CHEM 8, ED
BUN: 11 mg/dL (ref 6–20)
Calcium, Ion: 1.15 mmol/L (ref 1.15–1.40)
Chloride: 104 mmol/L (ref 98–111)
Creatinine, Ser: 1.1 mg/dL (ref 0.61–1.24)
Glucose, Bld: 87 mg/dL (ref 70–99)
HCT: 43 % (ref 39.0–52.0)
Hemoglobin: 14.6 g/dL (ref 13.0–17.0)
Potassium: 3.7 mmol/L (ref 3.5–5.1)
Sodium: 141 mmol/L (ref 135–145)
TCO2: 28 mmol/L (ref 22–32)

## 2022-03-27 LAB — LACTIC ACID, PLASMA: Lactic Acid, Venous: 1.1 mmol/L (ref 0.5–1.9)

## 2022-03-27 LAB — URINALYSIS, ROUTINE W REFLEX MICROSCOPIC
Bilirubin Urine: NEGATIVE
Glucose, UA: NEGATIVE mg/dL
Hgb urine dipstick: NEGATIVE
Ketones, ur: NEGATIVE mg/dL
Leukocytes,Ua: NEGATIVE
Nitrite: NEGATIVE
Protein, ur: NEGATIVE mg/dL
Specific Gravity, Urine: >1.046 — ABNORMAL HIGH (ref 1.005–1.030)
pH: 8 (ref 5.0–8.0)

## 2022-03-27 LAB — RESP PANEL BY RT-PCR (FLU A&B, COVID) ARPGX2
Influenza A by PCR: NEGATIVE
Influenza B by PCR: NEGATIVE
SARS Coronavirus 2 by RT PCR: NEGATIVE

## 2022-03-27 LAB — SAMPLE TO BLOOD BANK

## 2022-03-27 LAB — PROTIME-INR
INR: 1 (ref 0.8–1.2)
Prothrombin Time: 13.1 seconds (ref 11.4–15.2)

## 2022-03-27 LAB — ETHANOL: Alcohol, Ethyl (B): <10 mg/dL (ref <10)

## 2022-03-27 LAB — CBG MONITORING, ED: Glucose-Capillary: 106 mg/dL — ABNORMAL HIGH (ref 70–99)

## 2022-03-27 MED ORDER — OXYCODONE HCL 5 MG PO TABS: 5.0000 mg | ORAL_TABLET | ORAL | 0 refills | Status: DC | PRN

## 2022-03-27 MED ORDER — IOHEXOL 350 MG/ML SOLN
100.0000 mL | Freq: Once | INTRAVENOUS | Status: AC | PRN
  Administered 2022-03-27: 100 mL via INTRAVENOUS

## 2022-03-27 MED ORDER — METHOCARBAMOL 500 MG PO TABS: 500.0000 mg | ORAL_TABLET | Freq: Two times a day (BID) | ORAL | 0 refills | Status: DC

## 2022-03-27 MED ORDER — FENTANYL CITRATE PF 50 MCG/ML IJ SOSY
50.0000 ug | PREFILLED_SYRINGE | Freq: Once | INTRAMUSCULAR | Status: AC
  Administered 2022-03-27: 50 ug via INTRAVENOUS
  Filled 2022-03-27: qty 1
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
## 2022-03-27 NOTE — ED Notes (Signed)
This RN ambulated with patient in the hallway. Patient reported no shortness of breath or dizziness. Patient was able to ambulate safely with minimal assistance; patient reports mild cramping in right calf and when placing pressure on right foot.
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
## 2022-03-27 NOTE — ED Notes (Signed)
Pt back from xray and hooked back up to VS monitoring. Pain still 5/10. Pt GCS 14.
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
## 2022-03-27 NOTE — ED Notes (Signed)
Patient returned to room from CT. 
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
## 2022-04-01 ENCOUNTER — Ambulatory Visit (INDEPENDENT_AMBULATORY_CARE_PROVIDER_SITE_OTHER): Payer: 59 | Admitting: Sports Medicine

## 2022-04-01 ENCOUNTER — Ambulatory Visit: Payer: 59 | Admitting: Sports Medicine

## 2022-04-01 VITALS — BP 122/82 | HR 92 | Ht 69.0 in | Wt 214.0 lb

## 2022-04-01 DIAGNOSIS — G44319 Acute post-traumatic headache, not intractable: Secondary | ICD-10-CM | POA: Diagnosis not present

## 2022-04-01 DIAGNOSIS — S060X1A Concussion with loss of consciousness of 30 minutes or less, initial encounter: Secondary | ICD-10-CM

## 2022-04-01 DIAGNOSIS — M542 Cervicalgia: Secondary | ICD-10-CM

## 2022-04-01 DIAGNOSIS — G4700 Insomnia, unspecified: Secondary | ICD-10-CM | POA: Diagnosis not present

## 2022-04-01 NOTE — Patient Instructions (Addendum)
Good to see you  Complete mental and physical rest for 48 hours after concussive event Recommend light aerobic activity while keeping symptoms less than 3/10 Stop mental or physical activities that cause symptoms to worsen greater than 3/10, and wait 24 hours before attempting them again Eliminate screen time as much as possible for first 48 hours after concussive event, then continue limited screen time (recommend less than 2 hours per day) Work note provided Recommend melatonin 5-10 mg nightly for sleep  Neck HEP 1 week follow up

## 2022-04-08 ENCOUNTER — Ambulatory Visit: Payer: 59 | Admitting: Sports Medicine
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
## 2022-04-12 ENCOUNTER — Ambulatory Visit: Payer: 59 | Admitting: Sports Medicine

## 2022-04-12 VITALS — BP 118/82 | HR 84 | Ht 69.0 in | Wt 207.0 lb

## 2022-04-12 DIAGNOSIS — G4700 Insomnia, unspecified: Secondary | ICD-10-CM

## 2022-04-12 DIAGNOSIS — S060X1A Concussion with loss of consciousness of 30 minutes or less, initial encounter: Secondary | ICD-10-CM | POA: Diagnosis not present

## 2022-04-12 DIAGNOSIS — M542 Cervicalgia: Secondary | ICD-10-CM | POA: Diagnosis not present

## 2022-04-12 DIAGNOSIS — G44319 Acute post-traumatic headache, not intractable: Secondary | ICD-10-CM | POA: Diagnosis not present

## 2022-04-12 DIAGNOSIS — R270 Ataxia, unspecified: Secondary | ICD-10-CM

## 2022-04-12 NOTE — Patient Instructions (Addendum)
Good to see you  Vestibular pt  Neck HEP  1 week follow up

## 2022-04-16 ENCOUNTER — Telehealth: Payer: Self-pay

## 2022-04-16 NOTE — Telephone Encounter (Signed)
Faxed

## 2022-04-16 NOTE — Telephone Encounter (Signed)
Patient called stating that the [employer] forms that were filled out and sent are needing his last office visit notes sent/faxed with it stating he is not yet released back to work yet. He is saying they are telling him there is no proof or office notes stating that. So fax to the number that was on those forms.

## 2022-04-19 ENCOUNTER — Ambulatory Visit: Payer: 59 | Admitting: Sports Medicine

## 2022-04-19 VITALS — BP 118/80 | HR 102 | Ht 69.0 in | Wt 207.0 lb

## 2022-04-19 DIAGNOSIS — G44319 Acute post-traumatic headache, not intractable: Secondary | ICD-10-CM

## 2022-04-19 DIAGNOSIS — R270 Ataxia, unspecified: Secondary | ICD-10-CM

## 2022-04-19 DIAGNOSIS — S060X1A Concussion with loss of consciousness of 30 minutes or less, initial encounter: Secondary | ICD-10-CM | POA: Diagnosis not present

## 2022-04-19 DIAGNOSIS — M542 Cervicalgia: Secondary | ICD-10-CM
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
## 2022-04-19 NOTE — Patient Instructions (Addendum)
Good to see you Work note provided  1 week follow up

## 2022-04-20 ENCOUNTER — Telehealth: Payer: Self-pay | Admitting: Sports Medicine

## 2022-04-20 NOTE — Telephone Encounter (Signed)
Called and left VM that he would let his symptoms guide his screen time and to keep it below a 3/10

## 2022-04-20 NOTE — Telephone Encounter (Signed)
Patient called asking if his screen time restrictions would be any different at this point? He said that he was previously told to limit it to two hours a day.  Please advise.
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
## 2022-04-26 ENCOUNTER — Ambulatory Visit: Payer: 59 | Admitting: Sports Medicine

## 2022-04-26 VITALS — BP 124/80 | HR 91 | Ht 69.0 in | Wt 207.0 lb

## 2022-04-26 DIAGNOSIS — R270 Ataxia, unspecified: Secondary | ICD-10-CM

## 2022-04-26 DIAGNOSIS — G44319 Acute post-traumatic headache, not intractable: Secondary | ICD-10-CM

## 2022-04-26 DIAGNOSIS — S060X1A Concussion with loss of consciousness of 30 minutes or less, initial encounter: Secondary | ICD-10-CM

## 2022-04-26 DIAGNOSIS — F411 Generalized anxiety disorder: Secondary | ICD-10-CM

## 2022-04-26 MED ORDER — AMITRIPTYLINE HCL 10 MG PO TABS: 10.0000 mg | ORAL_TABLET | Freq: Every day | ORAL | 0 refills | Status: DC

## 2022-04-26 NOTE — Patient Instructions (Addendum)
Good to see you  Amitriptyline 10 mg nightly for 1 week  , increase to 20 mg nightly  Work note provided  Starting 05/03/2022 can return to maximum of 4 hours per day 05/10/2022 2 week follow up
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
## 2022-05-10 ENCOUNTER — Ambulatory Visit: Payer: 59 | Admitting: Sports Medicine

## 2022-05-10 VITALS — BP 118/78 | HR 98 | Ht 69.0 in | Wt 212.0 lb

## 2022-05-10 DIAGNOSIS — G44319 Acute post-traumatic headache, not intractable: Secondary | ICD-10-CM | POA: Diagnosis not present

## 2022-05-10 DIAGNOSIS — R270 Ataxia, unspecified: Secondary | ICD-10-CM

## 2022-05-10 DIAGNOSIS — S060X1A Concussion with loss of consciousness of 30 minutes or less, initial encounter: Secondary | ICD-10-CM | POA: Diagnosis not present

## 2022-05-10 DIAGNOSIS — F411 Generalized anxiety disorder: Secondary | ICD-10-CM | POA: Diagnosis not present

## 2022-05-10 NOTE — Patient Instructions (Addendum)
Good to see you  Continue amitriptyline 10 mg daily  Work note provided 2 week follow up

## 2022-05-18 ENCOUNTER — Other Ambulatory Visit: Payer: Self-pay | Admitting: Sports Medicine
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
## 2022-05-24 ENCOUNTER — Ambulatory Visit: Payer: 59 | Admitting: Sports Medicine

## 2022-05-24 VITALS — BP 122/82 | HR 85 | Ht 69.0 in | Wt 215.0 lb

## 2022-05-24 DIAGNOSIS — S060X1A Concussion with loss of consciousness of 30 minutes or less, initial encounter: Secondary | ICD-10-CM | POA: Diagnosis not present

## 2022-05-24 DIAGNOSIS — F411 Generalized anxiety disorder: Secondary | ICD-10-CM | POA: Diagnosis not present

## 2022-05-24 DIAGNOSIS — R270 Ataxia, unspecified: Secondary | ICD-10-CM | POA: Diagnosis not present

## 2022-05-24 DIAGNOSIS — G44319 Acute post-traumatic headache, not intractable: Secondary | ICD-10-CM

## 2022-05-24 NOTE — Patient Instructions (Addendum)
Good to see you  Cleared to return to work with no restrictions 1 week alternate between amitriptyline 10 mg and no medication and then discontinue after 1 week  2 week follow up
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
## 2022-06-08 ENCOUNTER — Ambulatory Visit (INDEPENDENT_AMBULATORY_CARE_PROVIDER_SITE_OTHER): Payer: 59 | Admitting: Sports Medicine

## 2022-06-08 VITALS — BP 122/82 | HR 91 | Ht 69.0 in | Wt 214.0 lb

## 2022-06-08 DIAGNOSIS — S060X1A Concussion with loss of consciousness of 30 minutes or less, initial encounter: Secondary | ICD-10-CM

## 2022-06-08 NOTE — Patient Instructions (Signed)
Good to see you   

## 2023-09-23 IMAGING — CT CT ABD-PELV W/ CM
2 of 4 series · 16 of 46 positions shown, 18 images · IV contrast (agent unspecified)
Comparison: CT dated 07/01/2018.

CLINICAL DATA: Right lower quadrant abdominal pain.

EXAM:
CT ABDOMEN AND PELVIS WITH CONTRAST
TECHNIQUE: Multidetector CT imaging of the abdomen and pelvis was performed
using the standard protocol following bolus administration of
intravenous contrast.

[Series 2: abd pel w · axial · 0.76mm/px · z∈[-396,+29]mm · 13 of 93 slices shown, 15 images]
[im 4/93  soft-tissue]
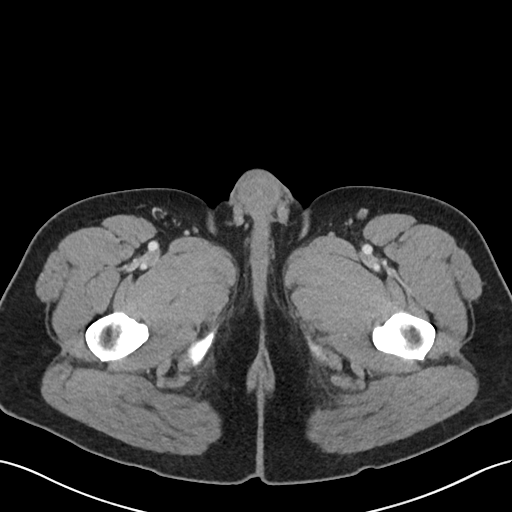
[im 4/93  bone]
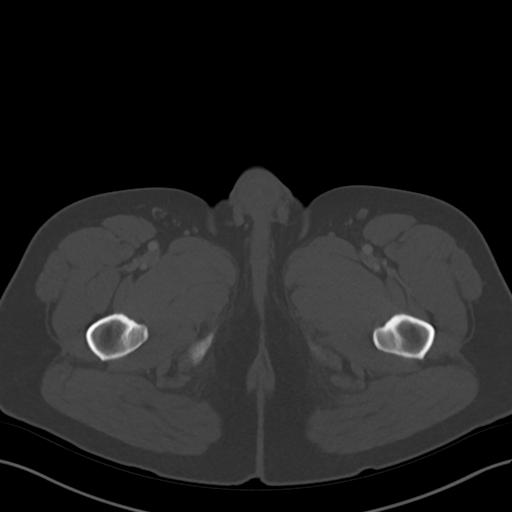
[im 11/93  soft-tissue]
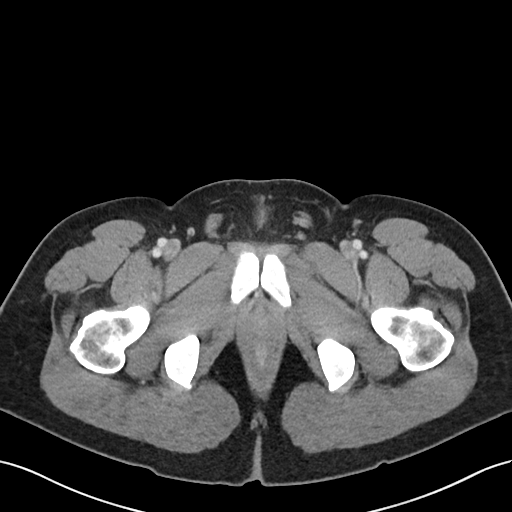
[im 18/93  soft-tissue]
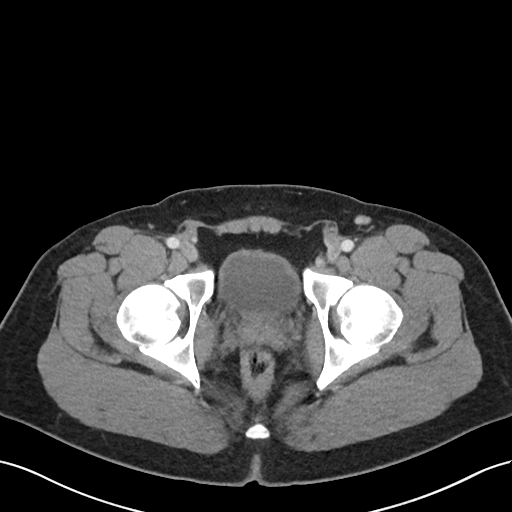
[im 25/93  soft-tissue]
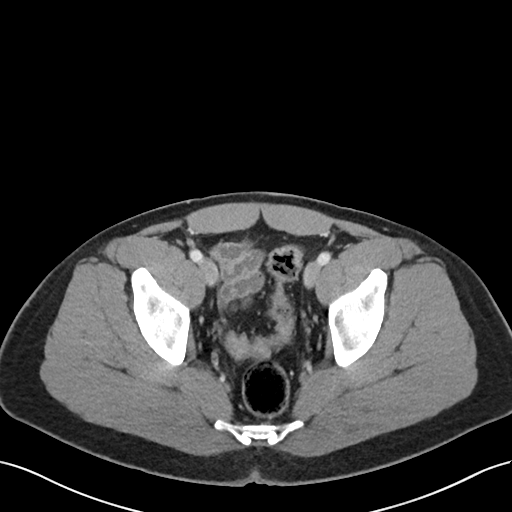
[im 32/93  soft-tissue]
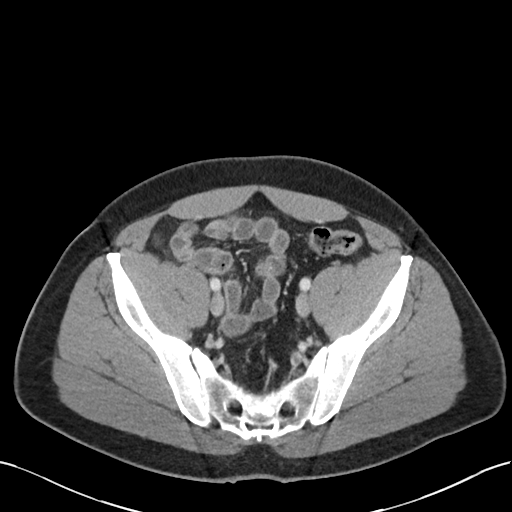
[im 39/93  soft-tissue]
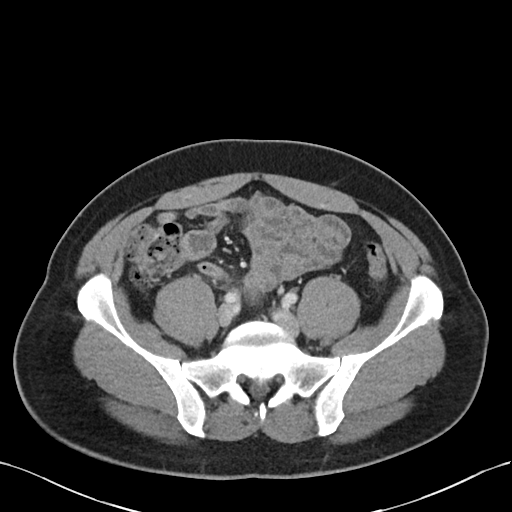
[im 47/93  soft-tissue]
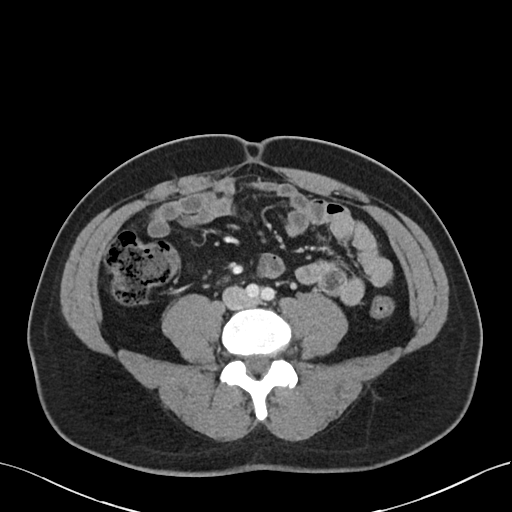
[im 54/93  soft-tissue]
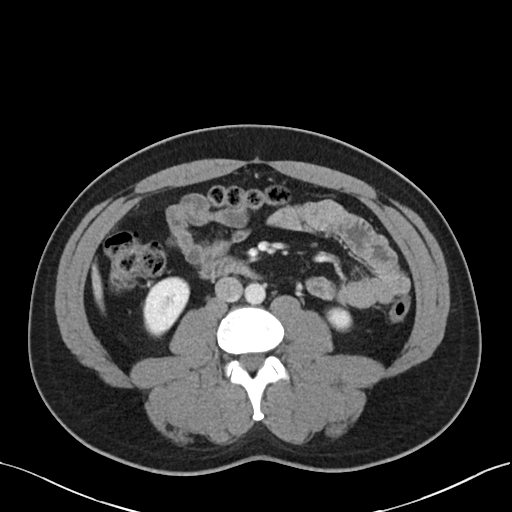
[im 61/93  soft-tissue]
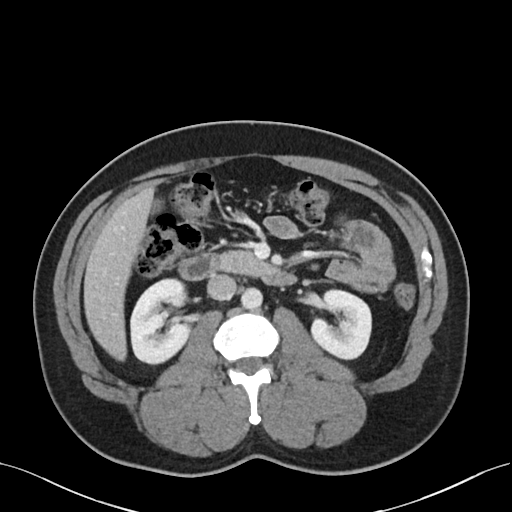
[im 61/93  bone]
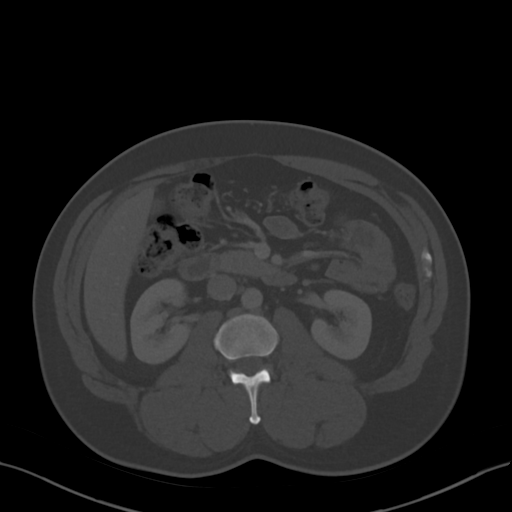
[im 68/93  soft-tissue]
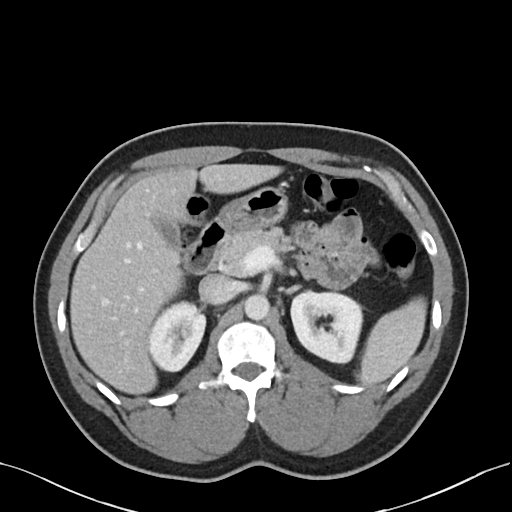
[im 75/93  soft-tissue]
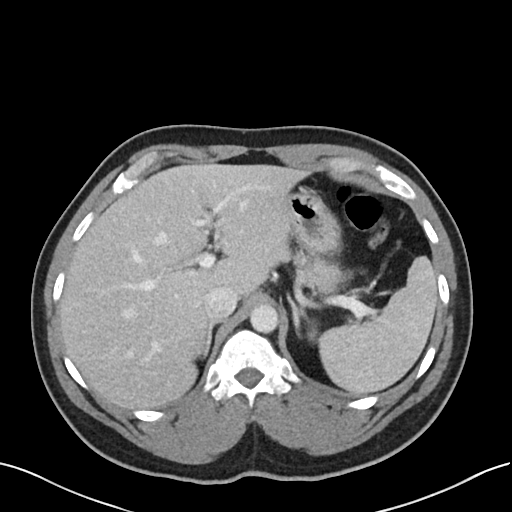
[im 82/93  soft-tissue]
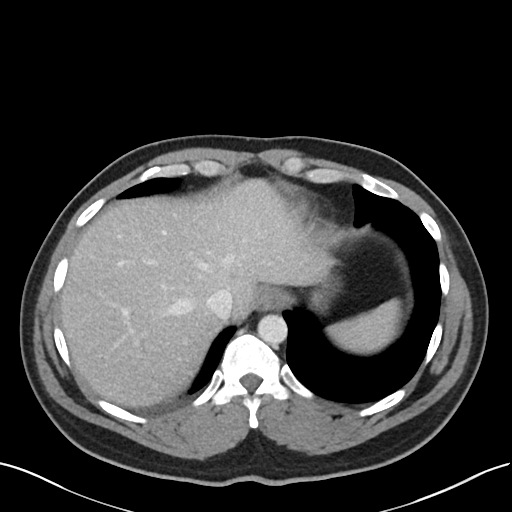
[im 89/93  soft-tissue]
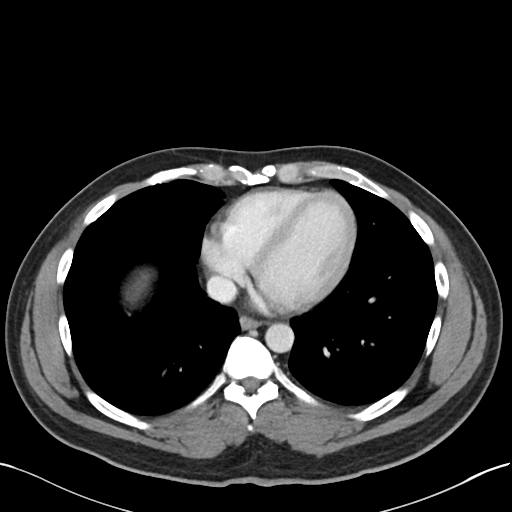

[Series 5: coronal · coronal · 0.81mm/px · 3 of 97 slices shown]
[im 33/97  soft-tissue]
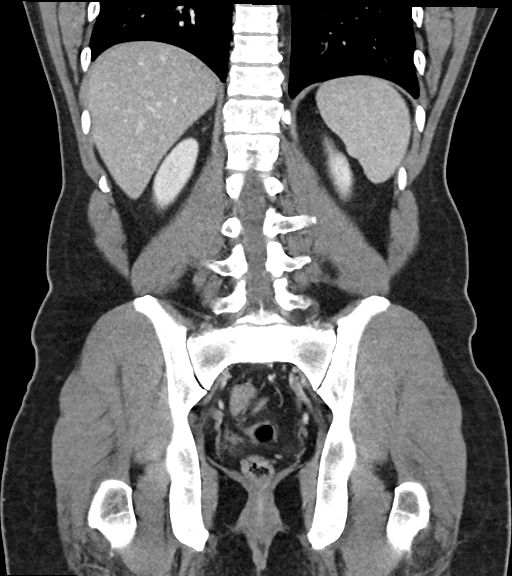
[im 43/97  soft-tissue]
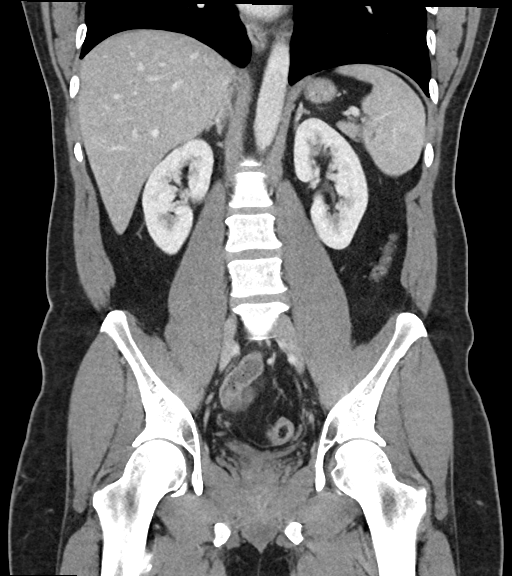
[im 54/97  soft-tissue]
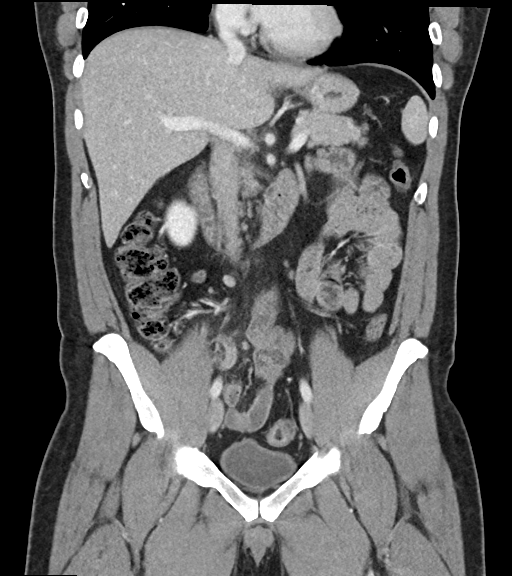

[16 of 46 positions shown; findings below may reference images not displayed]

RADIATION DOSE REDUCTION: This exam was performed according to the
departmental dose-optimization program which includes automated
exposure control, adjustment of the mA and/or kV according to
patient size and/or use of iterative reconstruction technique.

CONTRAST:  100mL OMNIPAQUE IOHEXOL 300 MG/ML  SOLN
FINDINGS: Lower chest: The visualized lung bases are clear.

No intra-abdominal free air or free fluid.

Hepatobiliary: No focal liver abnormality is seen. No gallstones,
gallbladder wall thickening, or biliary dilatation.

Pancreas: Unremarkable. No pancreatic ductal dilatation or
surrounding inflammatory changes.

Spleen: Normal in size without focal abnormality.

Adrenals/Urinary Tract: Adrenal glands are unremarkable. Kidneys are
normal, without renal calculi, focal lesion, or hydronephrosis.
Bladder is unremarkable.

Stomach/Bowel: No bowel obstruction. The appendix is mildly enlarged
measuring approximately 1 cm in thickness. There is mild
peritendinous seal haziness. Findings concerning for an early acute
appendicitis. No drainable fluid collection or abscess. The appendix
is located in the right lower quadrant medial to the cecum and
anterior to the right psoas muscle.

Vascular/Lymphatic: The abdominal aorta and IVC are unremarkable. No
portal venous gas. There is no adenopathy.

Reproductive: The prostate and seminal vesicles are grossly
unremarkable. No pelvic mass.

Other: Small fat containing umbilical hernia.

Musculoskeletal: No acute or significant osseous findings.
IMPRESSION: Findings concerning for an early acute appendicitis. No drainable
fluid collection or abscess.

## 2024-05-01 ENCOUNTER — Encounter: Payer: Self-pay | Admitting: Podiatrist

## 2024-05-01 ENCOUNTER — Ambulatory Visit: Admitting: Podiatrist

## 2024-05-01 ENCOUNTER — Ambulatory Visit

## 2024-05-01 DIAGNOSIS — M722 Plantar fascial fibromatosis: Secondary | ICD-10-CM | POA: Diagnosis not present

## 2024-05-01 DIAGNOSIS — M7751 Other enthesopathy of right foot: Secondary | ICD-10-CM

## 2024-05-01 MED ORDER — MELOXICAM 15 MG PO TABS: 15.0000 mg | ORAL_TABLET | Freq: Every day | ORAL | 2 refills | Status: DC

## 2024-05-01 NOTE — Patient Instructions (Signed)
 Exercises for Plantar Fasciitis Foot and leg exercises can help if you have plantar fasciitis. Only do the exercises you were told to do. Make sure you know how to do the exercises safely. Follow the steps below. It's normal to feel mild discomfort. Stop if you feel pain or your pain gets worse. Do not start these exercises until told by your health care provider. Stretching and range-of-motion exercises These exercises warm up your muscles and joints. They also help with movement and flexibility of your foot. They can help with pain. Plantar fascia stretch This exercise will stretch your plantar fascia, which is a band of thick tissue on the bottom of your foot. Sit with your left / right leg crossed over your other knee. Hold your heel with one hand with that thumb near your arch. With your other hand, hold your toes. Gently pull your toes back toward the top of your foot. You should feel a stretch on the bottom of your toes, on the bottom of your foot, or both. Hold this stretch for __________ seconds. Slowly let go of your toes. Go back to the starting position. Repeat __________ times. Do this exercise __________ times a day. Gastroc stretch, standing This exercise is called an upper calf, or gastroc, stretch. It stretches the muscles in the back of your upper calf. Stand with your hands against a wall. Extend your left / right leg behind you. Bend your front knee just a little. Keep your heels on the floor, your toes facing forward, and your back knee straight. Shift your weight toward the wall. Do not arch your back. You should feel a gentle stretch in your upper calf. Hold this position for __________ seconds. Repeat __________ times. Do this exercise __________ times a day. Soleus stretch, standing This exercise is called a lower calf, or soleus, stretch. It stretches the muscles in the back of your lower calf. Stand with your hands against a wall. Extend your left / right leg behind  you, and bend your front knee slightly. Keep your heels on the floor and your toes facing forward. Bend your back knee and shift your weight slightly over your back leg. You should feel a gentle stretch deep in your lower calf. Hold this position for __________ seconds. Repeat __________ times. Do this exercise __________ times a day. Gastroc and soleus stretch, standing step This exercise stretches the muscles in the back of your lower leg. This includes your gastroc and soleus muscles. Stand with the ball of your left / right foot on the front of a step. The ball of your foot is on the walking surface, right under your toes. Keep your other foot firmly on the same step. Hold on to the wall or a railing for balance. Slowly lift your other foot, letting your body weight press your heel down over the edge of the front of the step. Keep your knee straight and unbent. You should feel a stretch in your calf. Hold this position for __________ seconds. Return both feet to the step. Repeat this exercise with a slight bend in your left / right knee. Repeat __________ times with your left / right knee straight and __________ times with your left / right knee bent. Do this exercise __________ times a day. Balance exercise This exercise builds your balance and strength control of your arch. It helps take pressure off your plantar fascia. Single leg stand If this exercise is too easy, you can try it with your eyes closed  or while standing on a pillow. Without shoes, stand near a railing or in a doorway. You may hold on to the railing or doorway as needed. Stand on your left / right foot. Keep your big toe down on the floor. Lift the arch of your foot. You should feel a stretch across the bottom of your foot and arch. Do not let your foot roll inward. Hold this position for __________ seconds. Repeat __________ times. Do this exercise __________ times a day. This information is not intended to replace  advice given to you by your health care provider. Make sure you discuss any questions you have with your health care provider. Document Revised: 12/13/2022 Document Reviewed: 12/13/2022 Elsevier Patient Education  2024 [employer].

## 2024-05-01 NOTE — Progress Notes (Signed)
 Chief Complaint  Patient presents with   Foot Pain    Plantar heel bilateral (R>L) - aching x 3-4 weeks, was on a trip and did a lot of walking, AM pain and after sitting, tried heat pad massager-some help   New Patient (Initial Visit)    Est pt 2020     Subjective: Adrian Harper is a 43 y.o. male patient presents to office with complaint of moderate heel pain on the right greater than left heel. Patient admits to post static dyskinesia for 3-4 weeks in duration. Patient has treated this problem with heat and massage with no relief. Denies any other pedal complaints.   Patient Active Problem List   Diagnosis Date Noted   S/P laparoscopic appendectomy 09/25/2021   Status post surgery 09/24/2021   Tear of left scapholunate ligament 10/03/2018   Lumbar pain 09/06/2018   Acute pain of left wrist 08/11/2018    Current Outpatient Medications on File Prior to Visit  Medication Sig Dispense Refill   rosuvastatin (CRESTOR) 10 MG tablet Take 10 mg by mouth daily.     No current facility-administered medications on file prior to visit.    No Known Allergies  Objective: Physical Exam General: The patient is alert and oriented x3 in no acute distress.  Dermatology: Skin is warm, dry and supple bilateral lower extremities. Nails 1-10 are normal. There is no erythema, edema, no eccymosis, no open lesions present. Integument is otherwise unremarkable.  Vascular: Dorsalis Pedis pulse and Posterior Tibial pulse are 2/4 bilateral. Capillary fill time is immediate to all digits.  Neurological: Grossly intact to light touch with an achilles reflex of +2/5 and a  negative Tinel's sign bilateral.  Musculoskeletal: Tenderness to palpation at the central and medial calcaneal tubercale and through the insertion of the plantar fascia on the Right foot. Minimal pain at the insertion of plantar fascia left foot.  No pain with compression of calcaneus bilateral.  Pes planus foot type is noted.  There is  decreased Ankle joint range of motion bilateral. All other joints range of motion within normal limits bilateral. Strength 5/5 in all groups bilateral.   Xray, Right foot:  Pes planus foot type. Normal osseous mineralization. Joint spaces preserved. No fracture/dislocation/boney destruction. Small inferior and posterior calcaneal spur present with mild thickening of plantar fascia noted. No other soft tissue abnormalities or radiopaque foreign bodies noted.   Left foot:  pes planus foot type. Normal osseous mineralization.  No fracture or dislocation or acute osseous abnormalities present.  Joint spaces are normal. Small posterior calcaneal spur noted.      Assessment and Plan:   ICD-10-CM   1. Plantar fasciitis, bilateral  M72.2 DG Foot Complete Right    DG Foot Complete Left    2. Bursitis of right foot  M77.51         -Complete examination performed.  -Xrays reviewed - Discussed etiology and pathology of plantar fasciitis/ bursitis along with conservative approach to treatment.   -After oral consent and aseptic prep, injected a mixture containing 20mg  Kenalog and 5mg  marcaine  plain was infiltrated into the area of maximal tenderness of the Right heel. Post-injection care discussed with patient.  -Rx Meloxicam to start for 2 weeks and taper was advised after 2 weeks.  -Recommended good supportive shoes and advised use of orthotics he has from several years back.  -dispensed to patient daily stretching exercises.. -Patient to return to office in 4 weeks if symptoms worsen or fail to improve.

## 2024-06-12 ENCOUNTER — Encounter: Payer: Self-pay | Admitting: Podiatry

## 2024-06-12 ENCOUNTER — Ambulatory Visit: Admitting: Podiatry

## 2024-06-12 DIAGNOSIS — M722 Plantar fascial fibromatosis: Secondary | ICD-10-CM | POA: Diagnosis not present

## 2024-06-12 NOTE — Progress Notes (Signed)
 He presents today states that he is about 95% better as he refers to his bilateral plantar fasciitis.  States the lateral aspect of his right heel is the only part that bothers him and as if he steps on it the wrong way.  Objective: Vital signs are stable alert oriented x 3 no reproducible pain with palpation to the medial heel plantar heel just to the lateral heel on the right side only.  Assessment: 95% resolution of plantar fasciitis bilateral.  Plan: Discussed etiology pathology conservative versus surgical therapies at this point he is will follow-up with [facility] for orthotics.

## 2024-07-04 ENCOUNTER — Ambulatory Visit (INDEPENDENT_AMBULATORY_CARE_PROVIDER_SITE_OTHER): Admitting: Podiatrist

## 2024-07-04 DIAGNOSIS — M722 Plantar fascial fibromatosis: Secondary | ICD-10-CM

## 2024-07-04 DIAGNOSIS — M7750 Other enthesopathy of unspecified foot: Secondary | ICD-10-CM

## 2024-07-04 NOTE — Progress Notes (Signed)
 Visit:  ORTHOTIC SCAN/ EVALUATION  Patient presented for evaluation/ scan for custom molded foot orthotics.  Patient will benefit from custom foot orthotics to provide total contact to bilateral medial longitudinal arches to help balance and distribute body weight more evenly.  Thus reducing plantar pressure and pain.   Orthotic will encourage forefoot and rearfoot alignment.    Patient was scanned today with [facility] scanner.    Orthotics are ordered.  Signature obtained for notification of pricing/ fees for the device.  When the orthotic is ready for pick up, will call to make an appointment for a fitting.    Patient is getting  a new insurance coverage under [employer] for 2026.  Will need to call him to obtain insurance info for billing when orthotics are in.    Charges to be entered at time of dispensing.

## 2024-07-25 ENCOUNTER — Telehealth: Payer: Self-pay | Admitting: Podiatry

## 2024-07-25 NOTE — Telephone Encounter (Signed)
 Orthotics are in [place] office. Left a message for [facility] to scheduled appt to [facility]

## 2024-08-13 ENCOUNTER — Other Ambulatory Visit

## 2024-08-13 ENCOUNTER — Ambulatory Visit

## 2024-08-13 DIAGNOSIS — M722 Plantar fascial fibromatosis: Secondary | ICD-10-CM

## 2024-08-21 ENCOUNTER — Other Ambulatory Visit: Payer: Self-pay | Admitting: Podiatrist

## 2024-08-23 ENCOUNTER — Other Ambulatory Visit
